# Patient Record
Sex: Male | Born: 1935 | Race: White | Hispanic: No | Marital: Married | Smoking: Former smoker
Health system: Southern US, Community
[De-identification: ages and names within clinical notes are randomized; demographics above are authoritative.]

## PROBLEM LIST (undated history)

## (undated) DIAGNOSIS — I10 Essential (primary) hypertension: Secondary | ICD-10-CM

## (undated) HISTORY — PX: INGUINAL HERNIA REPAIR: SUR1180

## (undated) HISTORY — PX: EYE SURGERY: SHX253

## (undated) HISTORY — PX: SPINE SURGERY: SHX786

## (undated) HISTORY — DX: Essential (primary) hypertension: I10

---

## 1999-01-30 ENCOUNTER — Emergency Department (HOSPITAL_COMMUNITY): Admission: EM | Admit: 1999-01-30 | Discharge: 1999-01-30 | Payer: Self-pay | Admitting: Emergency Medicine

## 1999-01-30 ENCOUNTER — Encounter: Payer: Self-pay | Admitting: Emergency Medicine

## 1999-05-15 ENCOUNTER — Encounter: Admission: RE | Admit: 1999-05-15 | Discharge: 1999-05-15 | Payer: Self-pay | Admitting: Neurosurgery

## 1999-05-15 ENCOUNTER — Encounter: Payer: Self-pay | Admitting: Neurosurgery

## 2002-08-29 ENCOUNTER — Ambulatory Visit (HOSPITAL_COMMUNITY): Admission: RE | Admit: 2002-08-29 | Discharge: 2002-08-29 | Payer: Self-pay | Admitting: Ophthalmology

## 2008-01-05 ENCOUNTER — Ambulatory Visit: Payer: Self-pay | Admitting: Internal Medicine

## 2008-01-05 DIAGNOSIS — K5730 Diverticulosis of large intestine without perforation or abscess without bleeding: Secondary | ICD-10-CM | POA: Insufficient documentation

## 2008-01-05 DIAGNOSIS — R634 Abnormal weight loss: Secondary | ICD-10-CM | POA: Insufficient documentation

## 2008-01-05 DIAGNOSIS — Z860100 Personal history of colon polyps, unspecified: Secondary | ICD-10-CM | POA: Insufficient documentation

## 2010-09-19 NOTE — Op Note (Signed)
NAME:  Casey Davila, Casey Davila                          ACCOUNT NO.:  [id number]   MEDICAL RECORD NO.:  [id number]                   PATIENT TYPE:  OIB   LOCATION:  2874                                 FACILITY:  [facility]   PHYSICIAN:  Robert L. Dione Booze, M.D.               DATE OF BIRTH:  January 06, 1926   DATE OF PROCEDURE:  08/29/2002  DATE OF DISCHARGE:                                 OPERATIVE REPORT   INDICATIONS FOR PROCEDURE:  The patient was seen in my office on June 02, 2002, with the complaint that he had redundant skin of each upper eyelid and  also wanting to be looked at for possible macular degeneration.  He is 77  years old and is seen through the referral of Dr. Elesa Massed.  His mother  apparently had macular degeneration in her later years. He was bothered  significantly by the skin of each upper lid and this blocked his upper field  and he could feel the weight and caused fatigue.  Photographs were done  documenting the condition and the pupil margin reflex distance is 0, that is  the skin comes to the center of each pupil or actually droops over the  pupil. Visual field testing impressively shows the large amount of loss of  the superior field.  Photographs document this.  The pupils motility lids,  conjunctivae, cornea, anterior chamber, and dilated fundus examination does  show some sign of macular disease on the left and there are nuclear and  cortical cataract lens changes. On the left, the patient can be corrected to  20/25 vision in the right eye and 20/30 in the left.  After discussing the  problem, he decided he wanted to have upper eyelid blepharoplasties in order  to open up his visual field.  Please note that he wants this done for  medical reasons because of his medical complaints.  He is followed by  Geoffry Paradise, M.D. and he should be stable for this.   Justification for performing the procedure in outpatient setting is routine.  Justification for overnight stay is  none.   PREOPERATIVE DIAGNOSIS:  Severe dermatochalasis with visual impairment.   POSTOPERATIVE DIAGNOSIS:  Severe dermatochalasis with visual impairment.   SURGEON:  Robert L. Dione Booze, M.D.   PROCEDURE:  Upper eyelid blepharoplasties.   ANESTHESIA:  1% Xylocaine with epinephrine.   DESCRIPTION OF PROCEDURE:  The patient arrived in the minor surgery room at  [facility] and was prepped and draped in the usual sterile fashion.  The skin to be demarcated was carefully marked off and a frontal lid block  consisting of 1% Xylocaine with epinephrine was given.  Some block was given  under the skin of the upper lid also. The demarcated area of skin was then  excised using a scalpel and scissors and underlying fatty tissue was  excised.  Bleeding was controlled with a battery-powered cautery  and with  pressure.  Each wound was closed with a running 6-0 nylon suture and  pressure patches were applied. The patient left the minor room at [facility] having done well.    FOLLOW UP:  The patient is to be seen in my office next six days to have the  patches removed. He is to keep the eyes patched for three hours.  He is to  keep his head upright for three hours. He is to use warm compresses twice  daily and Polysporin ointment in his eyes at night.                                               Robert L. Dione Booze, M.D.    RLG/MEDQ  D:  08/29/2002  T:  08/29/2002  Job:  [id number]   cc:   Geoffry Paradise, M.D.  [address]  [place]  [place] [address]  Fax: [phone]

## 2013-03-17 ENCOUNTER — Other Ambulatory Visit: Payer: Self-pay | Admitting: Internal Medicine

## 2013-03-17 DIAGNOSIS — R109 Unspecified abdominal pain: Secondary | ICD-10-CM

## 2013-03-21 ENCOUNTER — Ambulatory Visit
Admission: RE | Admit: 2013-03-21 | Discharge: 2013-03-21 | Disposition: A | Discharge status: Home / Self Care | Payer: Medicare Other | Source: Ambulatory Visit | Attending: Internal Medicine | Admitting: Internal Medicine

## 2013-03-21 DIAGNOSIS — R109 Unspecified abdominal pain: Secondary | ICD-10-CM

## 2013-03-21 MED ORDER — IOHEXOL 300 MG/ML  SOLN
100.0000 mL | Freq: Once | INTRAMUSCULAR | Status: AC | PRN
  Administered 2013-03-21: 100 mL via INTRAVENOUS

## 2013-04-04 ENCOUNTER — Encounter (INDEPENDENT_AMBULATORY_CARE_PROVIDER_SITE_OTHER): Payer: Self-pay

## 2013-04-04 ENCOUNTER — Encounter (INDEPENDENT_AMBULATORY_CARE_PROVIDER_SITE_OTHER): Payer: Self-pay | Admitting: Surgery

## 2013-04-04 ENCOUNTER — Telehealth (INDEPENDENT_AMBULATORY_CARE_PROVIDER_SITE_OTHER): Payer: Self-pay

## 2013-04-04 ENCOUNTER — Ambulatory Visit (INDEPENDENT_AMBULATORY_CARE_PROVIDER_SITE_OTHER): Payer: Medicare Other | Admitting: Surgery

## 2013-04-04 VITALS — BP 146/80 | HR 80 | Temp 97.0°F | Resp 16 | Ht 71.0 in | Wt 160.0 lb

## 2013-04-04 DIAGNOSIS — K4190 Unilateral femoral hernia, without obstruction or gangrene, not specified as recurrent: Secondary | ICD-10-CM

## 2013-04-04 DIAGNOSIS — N433 Hydrocele, unspecified: Secondary | ICD-10-CM | POA: Insufficient documentation

## 2013-04-04 NOTE — Patient Instructions (Signed)
Hernia A hernia occurs when an internal organ pushes out through a weak spot in the abdominal wall. Hernias most commonly occur in the groin and around the navel. Hernias often can be pushed back into place (reduced). Most hernias tend to get worse over time. Some abdominal hernias can get stuck in the opening (irreducible or incarcerated hernia) and cannot be reduced. An irreducible abdominal hernia which is tightly squeezed into the opening is at risk for impaired blood supply (strangulated hernia). A strangulated hernia is a medical emergency. Because of the risk for an irreducible or strangulated hernia, surgery may be recommended to repair a hernia. CAUSES   Heavy lifting.  Prolonged coughing.  Straining to have a bowel movement.  A cut (incision) made during an abdominal surgery. HOME CARE INSTRUCTIONS   Bed rest is not required. You may continue your normal activities.  Avoid lifting more than 10 pounds (4.5 kg) or straining.  Cough gently. If you are a smoker it is best to stop. Even the best hernia repair can break down with the continual strain of coughing. Even if you do not have your hernia repaired, a cough will continue to aggravate the problem.  Do not wear anything tight over your hernia. Do not try to keep it in with an outside bandage or truss. These can damage abdominal contents if they are trapped within the hernia sac.  Eat a normal diet.  Avoid constipation. Straining over long periods of time will increase hernia size and encourage breakdown of repairs. If you cannot do this with diet alone, stool softeners may be used. SEEK IMMEDIATE MEDICAL CARE IF:   You have a fever.  You develop increasing abdominal pain.  You feel nauseous or vomit.  Your hernia is stuck outside the abdomen, looks discolored, feels hard, or is tender.  You have any changes in your bowel habits or in the hernia that are unusual for you.  You have increased pain or swelling around the  hernia.  You cannot push the hernia back in place by applying gentle pressure while lying down. MAKE SURE YOU:   Understand these instructions.  Will watch your condition.  Will get help right away if you are not doing well or get worse. Document Released: 04/20/2005 Document Revised: 07/13/2011 Document Reviewed: 12/08/2007 Exit[employer] Patient Information 2014 [employer].  

## 2013-04-04 NOTE — Telephone Encounter (Signed)
Referral given to [facility] for appt with Dr Annabell Howells in urology.

## 2013-04-04 NOTE — Progress Notes (Signed)
General Surgery [facility]  Chief Complaint  Patient presents with  . Abdominal Pain    right lower quadrant abdominal pain - referral from Dr. Geoffry Paradise, [facility]   HISTORY: Patient is a an 79 -year-old male referred by his primary care physician for a newly diagnosed right sided hernia. Patient had undergone a previous right inguinal hernia repair by Dr. [name] approximately 5 years ago. Approximately 6 weeks ago the patient noted discomfort in the right groin. This resolved. Approximately 3 weeks ago the patient experienced a recurrence of his discomfort. He was seen and evaluated in the office and felt to have a possible recurrence of his right-sided hernia. CT scan of the abdomen and pelvis was obtained on 03/21/2013. This showed a small left inguinal hernia containing fat. There was a right femoral hernia containing fluid. There was also moderate enlargement of the prostate which was inhomogeneous.  Patient also notes a long-standing hydrocele for which he wears a truss for support.  Past Medical History  Diagnosis Date  . Hypertension     Current Outpatient Prescriptions  Medication Sig Dispense Refill  . amLODipine (NORVASC) 10 MG tablet Take 10 mg by mouth daily. One half tab daily      . Cholecalciferol (D3-1000 PO) Take by mouth.      . hydrochlorothiazide (HYDRODIURIL) 25 MG tablet Take 25 mg by mouth daily.      Marland Kitchen losartan (COZAAR) 100 MG tablet Take 100 mg by mouth daily.      Marland Kitchen OVER THE COUNTER MEDICATION Take 50 mg by mouth daily. Vision formula luten       No current facility-administered medications for this visit.    No Known Allergies  Family History  Problem Relation Age of Onset  . Cancer Father     History   Social History  . Marital Status: Married    Spouse Name: N/A    Number of Children: N/A  . Years of Education: N/A   Social History Main Topics  . Smoking status: Former Smoker    Quit date:  04/04/1973  . Smokeless tobacco: None  . Alcohol Use: Yes     Comment: wine occ  . Drug Use: No  . Sexual Activity: None   Other Topics Concern  . None   Social History Narrative  . None    REVIEW OF SYSTEMS - PERTINENT POSITIVES ONLY: Denies any signs or symptoms of obstruction. No change in bowel habits. No problems with voiding. Intermittent pain lasting approximately 2 hours.  EXAM: Filed Vitals:   04/04/13 1116  BP: 146/80  Pulse: 80  Temp: 97 F (36.1 C)  Resp: 16    HEENT: normocephalic; pupils equal and reactive; sclerae clear; dentition good; mucous membranes moist NECK:  symmetric on extension; no palpable anterior or posterior cervical lymphadenopathy; no supraclavicular masses; no tenderness CHEST: clear to auscultation bilaterally without rales, rhonchi, or wheezes CARDIAC: regular rate and rhythm without significant murmur; peripheral pulses are full ABDOMEN: soft without distension; bowel sounds present; no mass; no hepatosplenomegaly; no hernia GU:  Normal male genitalia without mass or lesion; large hydrocele right scrotum, nontender; palpable right femoral hernia, approximately 2 cm, minimally tender, not reducible; well-healed right inguinal incision; palpation in the right inguinal canal and left inguinal canal with cough and Valsalva shows no sign of inguinal hernia bilaterally EXT:  non-tender without edema; no deformity NEURO: no gross focal deficits; no sign of tremor   LABORATORY RESULTS: See [facility] (CHL-Epic) for most recent results  RADIOLOGY RESULTS: See [facility] (CHL-Epic) for most recent results  IMPRESSION: #1 right femoral hernia, symptomatic #2 right hydrocele, moderate, symptomatic  PLAN: I discussed the above findings at length with the patient. He would like to undergo repair of his right femoral hernia. I think it would be straightforward for him to undergo repair of his hydrocele concurrently. I will discuss this  with Dr. Bjorn Pippin from urology and we will arrange for consultation. Patient should be evaluated at that time for benign prostatic hypertrophy. Hopefully we can perform a procedure for repair of femoral hernia and hydrocele as an outpatient at a time in the near future which is convenient for the patient.  The risks and benefits of the procedure have been discussed at length with the patient.  The patient understands the proposed procedure, potential alternative treatments, and the course of recovery to be expected.  All of the patient's questions have been answered at this time.  The patient wishes to proceed with surgery.  Velora Heckler, MD, FACS General & Endocrine Surgery [facility]  Primary Care Physician: Minda Meo, MD

## 2013-04-11 ENCOUNTER — Telehealth (INDEPENDENT_AMBULATORY_CARE_PROVIDER_SITE_OTHER): Payer: Self-pay | Admitting: General Surgery

## 2013-04-11 NOTE — Telephone Encounter (Signed)
Pt called to ask why his surgery was going to be in January.  Explained that the scheduler had to co-ordinate Dr. Michaell Cowing and Dr. Annabell Howells to be available at the same time on the same day, as well as an operating room for the length of time necessary.  Also, it is ultimately safer for him to be under anesthesia only one time, rather than twice for two separate surgeries.  After discuss and answering his questions, he is satisfied and will wait to hear from [place] on the date an time.

## 2013-04-12 ENCOUNTER — Emergency Department (HOSPITAL_COMMUNITY)
Admission: EM | Admit: 2013-04-12 | Discharge: 2013-04-13 | Disposition: A | Discharge status: Home / Self Care | Payer: Medicare Other | Attending: Emergency Medicine | Admitting: Emergency Medicine

## 2013-04-12 ENCOUNTER — Encounter (HOSPITAL_COMMUNITY): Payer: Self-pay | Admitting: Emergency Medicine

## 2013-04-12 DIAGNOSIS — Z79899 Other long term (current) drug therapy: Secondary | ICD-10-CM | POA: Insufficient documentation

## 2013-04-12 DIAGNOSIS — Z87891 Personal history of nicotine dependence: Secondary | ICD-10-CM | POA: Insufficient documentation

## 2013-04-12 DIAGNOSIS — K469 Unspecified abdominal hernia without obstruction or gangrene: Secondary | ICD-10-CM | POA: Insufficient documentation

## 2013-04-12 DIAGNOSIS — N433 Hydrocele, unspecified: Secondary | ICD-10-CM | POA: Insufficient documentation

## 2013-04-12 DIAGNOSIS — I10 Essential (primary) hypertension: Secondary | ICD-10-CM | POA: Insufficient documentation

## 2013-04-12 MED ORDER — ONDANSETRON HCL 4 MG/2ML IJ SOLN
4.0000 mg | Freq: Once | INTRAMUSCULAR | Status: AC
  Administered 2013-04-13: 4 mg via INTRAVENOUS
  Filled 2013-04-12: qty 2

## 2013-04-12 MED ORDER — FENTANYL CITRATE 0.05 MG/ML IJ SOLN
50.0000 ug | Freq: Once | INTRAMUSCULAR | Status: AC
  Administered 2013-04-13: 50 ug via INTRAVENOUS
  Filled 2013-04-12: qty 2

## 2013-04-12 NOTE — ED Provider Notes (Signed)
CSN: [id number]     Arrival date & time 04/12/13  2238 History   First MD Initiated Contact with Patient 04/12/13 2250     Chief Complaint  Patient presents with  . Groin Pain   (Consider location/radiation/quality/duration/timing/severity/associated sxs/prior Treatment) Patient is a 79 y.o. male presenting with groin pain.  Groin Pain Pertinent negatives include no abdominal pain or chest pain.   79 yo male with hx of RIGHT femoral hernia presents with Right sided groin pain since 8 pm tonight. Herniation occurred after showering. Patient states this has been going on for several months and usually goes away on its own. Patient Describes pain as 7/10 dull and aching. Patient denies anything making it worse or better. Has taken 325 mg aspirin with no pain relief. Denies N/V/D/C, Fever, Chills.  Past Medical History  Diagnosis Date  . Hypertension    Past Surgical History  Procedure Laterality Date  . Inguinal hernia repair Right 25 years ago  . Eye surgery    . Spine surgery  15 years ago   Family History  Problem Relation Age of Onset  . Cancer Father    History  Substance Use Topics  . Smoking status: Former Smoker    Quit date: 04/04/1973  . Smokeless tobacco: Not on file  . Alcohol Use: Yes     Comment: wine occ    Review of Systems  Respiratory: Negative for shortness of breath.   Cardiovascular: Negative for chest pain.  Gastrointestinal: Negative for abdominal pain and blood in stool.  Genitourinary: Negative for dysuria, hematuria and difficulty urinating.    Allergies  Review of patient's allergies indicates no known allergies.  Home Medications   Current Outpatient Rx  Name  Route  Sig  Dispense  Refill  . amLODipine (NORVASC) 10 MG tablet   Oral   Take 10 mg by mouth daily. One half tab daily         . Cholecalciferol (D3-1000 PO)   Oral   Take 1 tablet by mouth daily.          . hydrochlorothiazide (HYDRODIURIL) 25 MG tablet   Oral   Take  25 mg by mouth daily.         Marland Kitchen losartan (COZAAR) 100 MG tablet   Oral   Take 100 mg by mouth daily.         Marland Kitchen OVER THE COUNTER MEDICATION   Oral   Take 50 mg by mouth daily. Vision formula luten         . ondansetron (ZOFRAN ODT) 4 MG disintegrating tablet      4mg  ODT q4 hours prn nausea/vomit   8 tablet   0   . oxyCODONE-acetaminophen (PERCOCET/ROXICET) 5-325 MG per tablet   Oral   Take 1 tablet by mouth every 6 (six) hours as needed for moderate pain or severe pain.   12 tablet   0    BP 154/75  Pulse 77  Temp(Src) 98 F (36.7 C) (Oral)  Resp 19  SpO2 96% Physical Exam  Nursing note and vitals reviewed. Constitutional: He is oriented to person, place, and time. He appears well-developed and well-nourished.  HENT:  Head: Normocephalic and atraumatic.  Cardiovascular: Normal rate, regular rhythm and normal heart sounds.   Pulmonary/Chest: Effort normal and breath sounds normal.  Abdominal: Soft. Bowel sounds are normal. He exhibits no distension. There is no tenderness.  Genitourinary: Penis normal.     Right sided hernia with notable bulge at inguinal fold  and audible bowel sounds. Right sided scrotal swelling consistent with hydrocele, no audible bowel sounds.   Neurological: He is alert and oriented to person, place, and time.  Skin: Skin is warm and dry. He is not diaphoretic.  Psychiatric: He has a normal mood and affect. His behavior is normal.    ED Course  Procedures (including critical care time) Labs Review Labs Reviewed  CBC WITH DIFFERENTIAL - Abnormal; Notable for the following:    Hemoglobin 11.6 (*)    HCT 34.3 (*)    RDW 15.6 (*)    All other components within normal limits  BASIC METABOLIC PANEL - Abnormal; Notable for the following:    Sodium 128 (*)    Chloride 88 (*)    Glucose, Bld 109 (*)    GFR calc non Af Amer 73 (*)    GFR calc Af Amer 85 (*)    All other components within normal limits  URINALYSIS, ROUTINE W REFLEX  MICROSCOPIC  CG4 I-STAT (LACTIC ACID)   Imaging Review No results found.  EKG Interpretation   None       MDM   1. Hernia   2. Hydrocele      Patient placed in trendelenberg position, pain controlled, and ice pack placed on RIGHT sided hernia. Hernia non reducible. Bowel sounds present. Exam consistent with previous exam by general surgery on 04/04/13, at which time plans for outpatient surgery were being arranged. Physical exam appears to be patient's baseline according to previous records. Patient's pain appears to come and go though from the hx the hernia appears unchanged.   Labs show hyponatremia, suspect secondary to HCTZ tx. Mild anemia, though patient is asymptomatic. Patient pain controlled in ED. Discussed plan with patient for follow up with surgery. Advised patient to follow up with surgeon tomorrow to expedite the process. Patient discharged in good condition.   Patient discussed with Dr. Eber Hong.   Rudene Anda, PA-C 04/13/13 [id number]

## 2013-04-12 NOTE — ED Notes (Signed)
Bed: XB14 Expected date:  Expected time:  Means of arrival:  Comments: EMS 79yo abd pain Hiatal Hernia

## 2013-04-12 NOTE — ED Notes (Signed)
Pt has had intermittant right groin pain seen by PMD and is scheduled for R inguinal hernia repair 2nd week in January 2015 report pain 8/10 this evening

## 2013-04-13 ENCOUNTER — Telehealth (INDEPENDENT_AMBULATORY_CARE_PROVIDER_SITE_OTHER): Payer: Self-pay

## 2013-04-13 ENCOUNTER — Other Ambulatory Visit: Payer: Self-pay | Admitting: Urology

## 2013-04-13 LAB — CBC WITH DIFFERENTIAL/PLATELET
Eosinophils Absolute: 0.1 10*3/uL (ref 0.0–0.7)
Eosinophils Relative: 1 % (ref 0–5)
Hemoglobin: 11.6 g/dL — ABNORMAL LOW (ref 13.0–17.0)
Lymphocytes Relative: 41 % (ref 12–46)
Lymphs Abs: 1.9 10*3/uL (ref 0.7–4.0)
MCH: 27 pg (ref 26.0–34.0)
MCV: 79.8 fL (ref 78.0–100.0)
Monocytes Relative: 12 % (ref 3–12)
Neutro Abs: 2.1 10*3/uL (ref 1.7–7.7)
Platelets: 196 10*3/uL (ref 150–400)
RBC: 4.3 MIL/uL (ref 4.22–5.81)
WBC: 4.7 10*3/uL (ref 4.0–10.5)

## 2013-04-13 LAB — BASIC METABOLIC PANEL
BUN: 17 mg/dL (ref 6–23)
CO2: 25 mEq/L (ref 19–32)
Calcium: 9.2 mg/dL (ref 8.4–10.5)
Chloride: 88 mEq/L — ABNORMAL LOW (ref 96–112)
GFR calc non Af Amer: 73 mL/min — ABNORMAL LOW (ref >90)
Glucose, Bld: 109 mg/dL — ABNORMAL HIGH (ref 70–99)
Potassium: 3.7 mEq/L (ref 3.5–5.1)
Sodium: 128 mEq/L — ABNORMAL LOW (ref 135–145)

## 2013-04-13 LAB — URINALYSIS, ROUTINE W REFLEX MICROSCOPIC
Bilirubin Urine: NEGATIVE
Glucose, UA: NEGATIVE mg/dL
Hgb urine dipstick: NEGATIVE
Ketones, ur: NEGATIVE mg/dL
Nitrite: NEGATIVE
Urobilinogen, UA: 0.2 mg/dL (ref 0.0–1.0)

## 2013-04-13 LAB — CG4 I-STAT (LACTIC ACID): Lactic Acid, Venous: 1.36 mmol/L (ref 0.5–2.2)

## 2013-04-13 MED ORDER — ONDANSETRON 4 MG PO TBDP: ORAL_TABLET | ORAL | Status: AC

## 2013-04-13 MED ORDER — OXYCODONE-ACETAMINOPHEN 5-325 MG PO TABS: 1.0000 | ORAL_TABLET | Freq: Four times a day (QID) | ORAL | Status: DC | PRN

## 2013-04-13 MED ORDER — HYDROCODONE-ACETAMINOPHEN 5-325 MG PO TABS: 1.0000 | ORAL_TABLET | Freq: Four times a day (QID) | ORAL | Status: DC | PRN

## 2013-04-13 NOTE — Telephone Encounter (Signed)
I called pt and spoke with pts wife. Pt is resting and she will pick up RX this morning in case pt needs it for discomfort. Debbie in scheduling office is working with Dr Belva Crome office to try to coordinate case. She will call pt and his wife today after she has clarified date. Pt will refrain from pushing,pulling or straining and call with any concerns.

## 2013-04-13 NOTE — ED Provider Notes (Signed)
79 year old male with a history of a right inguinal hernia repair in the past who has recently had recurrence of pain in the right groin. According to the medical record he had a recent visit with the general surgeon who noted that he had a right inguinal hernia which was minimally tender and nonreducible as well as a large right-sided hydrocele. On his exam today he has a soft nontender abdomen with and again noted right-sided nonreducible minimally tender inguinal femoral hernia and a large nontender very soft right sided hemiscrotum likely representing a hydrocele. He has no nausea or vomiting.   I discussed the pt's care with Dr. Daphine Deutscher of the surgical service who states that he can be seen in the office - after reviewing the pt's record including the surgical notes and the CT results, his exam is no different today and it doesn't appear that he has an incarcerated or strangulated hernia.  Labs reassuring.  Pt in agreement with dispo after discussion.  Medical screening examination/treatment/procedure(s) were conducted as a shared visit with non-physician practitioner(s) and myself. I personally evaluated the patient during the encounter.   Clinical Impression: R sided inguinal hernia   Vida Roller, MD 04/13/13 [id number]

## 2013-04-13 NOTE — Telephone Encounter (Signed)
Message copied by Joanette Gula on Thu Apr 13, 2013  8:49 AM ------      Message from: Velora Heckler      Created: Thu Apr 13, 2013  7:56 AM       Casey Davila,            Call Mr. Schipani today and see how he is doing.  Went to ER last night for groin pain and was discharged home.            Patient was supposed to see Dr. Annabell Howells in consultation for hydrocele.  We were planning combined procedure in near future.  Don't know if OR is scheduled yet or not.            Let me know if I need to do anything right now.            Thanks,            tmg             ------

## 2013-04-13 NOTE — ED Notes (Signed)
Pt is aware of the need for a urine sample. However states he is unable to provide one at this time. Urinal at bedside.

## 2013-04-15 ENCOUNTER — Emergency Department (HOSPITAL_COMMUNITY): Payer: Medicare Other | Admitting: Anesthesiology

## 2013-04-15 ENCOUNTER — Inpatient Hospital Stay (HOSPITAL_COMMUNITY): Payer: Medicare Other

## 2013-04-15 ENCOUNTER — Inpatient Hospital Stay (HOSPITAL_COMMUNITY)
Admission: EM | Admit: 2013-04-15 | Discharge: 2013-05-04 | DRG: 350 | Disposition: E | Payer: Medicare Other | Attending: Internal Medicine | Admitting: Internal Medicine

## 2013-04-15 ENCOUNTER — Emergency Department (HOSPITAL_COMMUNITY): Payer: Medicare Other

## 2013-04-15 ENCOUNTER — Encounter (HOSPITAL_COMMUNITY): Admission: EM | Disposition: E | Payer: Self-pay | Source: Home / Self Care | Attending: Pulmonary Disease

## 2013-04-15 ENCOUNTER — Encounter (HOSPITAL_COMMUNITY): Payer: Self-pay | Admitting: Emergency Medicine

## 2013-04-15 ENCOUNTER — Encounter (HOSPITAL_COMMUNITY): Payer: Medicare Other | Admitting: Anesthesiology

## 2013-04-15 DIAGNOSIS — A419 Sepsis, unspecified organism: Secondary | ICD-10-CM | POA: Diagnosis present

## 2013-04-15 DIAGNOSIS — I4729 Other ventricular tachycardia: Secondary | ICD-10-CM | POA: Diagnosis not present

## 2013-04-15 DIAGNOSIS — IMO0001 Reserved for inherently not codable concepts without codable children: Principal | ICD-10-CM | POA: Diagnosis present

## 2013-04-15 DIAGNOSIS — E872 Acidosis, unspecified: Secondary | ICD-10-CM | POA: Diagnosis not present

## 2013-04-15 DIAGNOSIS — K4130 Unilateral femoral hernia, with obstruction, without gangrene, not specified as recurrent: Secondary | ICD-10-CM

## 2013-04-15 DIAGNOSIS — J9600 Acute respiratory failure, unspecified whether with hypoxia or hypercapnia: Secondary | ICD-10-CM | POA: Diagnosis not present

## 2013-04-15 DIAGNOSIS — R109 Unspecified abdominal pain: Secondary | ICD-10-CM

## 2013-04-15 DIAGNOSIS — D696 Thrombocytopenia, unspecified: Secondary | ICD-10-CM

## 2013-04-15 DIAGNOSIS — D691 Qualitative platelet defects: Secondary | ICD-10-CM | POA: Diagnosis present

## 2013-04-15 DIAGNOSIS — K56609 Unspecified intestinal obstruction, unspecified as to partial versus complete obstruction: Secondary | ICD-10-CM

## 2013-04-15 DIAGNOSIS — I10 Essential (primary) hypertension: Secondary | ICD-10-CM | POA: Diagnosis present

## 2013-04-15 DIAGNOSIS — I472 Ventricular tachycardia, unspecified: Secondary | ICD-10-CM | POA: Diagnosis not present

## 2013-04-15 DIAGNOSIS — J690 Pneumonitis due to inhalation of food and vomit: Secondary | ICD-10-CM

## 2013-04-15 DIAGNOSIS — K560 Paralytic ileus: Secondary | ICD-10-CM | POA: Diagnosis not present

## 2013-04-15 DIAGNOSIS — N179 Acute kidney failure, unspecified: Secondary | ICD-10-CM | POA: Diagnosis present

## 2013-04-15 DIAGNOSIS — R579 Shock, unspecified: Secondary | ICD-10-CM

## 2013-04-15 DIAGNOSIS — E876 Hypokalemia: Secondary | ICD-10-CM | POA: Diagnosis not present

## 2013-04-15 DIAGNOSIS — D72829 Elevated white blood cell count, unspecified: Secondary | ICD-10-CM | POA: Diagnosis not present

## 2013-04-15 DIAGNOSIS — E46 Unspecified protein-calorie malnutrition: Secondary | ICD-10-CM

## 2013-04-15 DIAGNOSIS — J80 Acute respiratory distress syndrome: Secondary | ICD-10-CM

## 2013-04-15 DIAGNOSIS — I4891 Unspecified atrial fibrillation: Secondary | ICD-10-CM | POA: Diagnosis not present

## 2013-04-15 DIAGNOSIS — E871 Hypo-osmolality and hyponatremia: Secondary | ICD-10-CM | POA: Diagnosis present

## 2013-04-15 DIAGNOSIS — Y838 Other surgical procedures as the cause of abnormal reaction of the patient, or of later complication, without mention of misadventure at the time of the procedure: Secondary | ICD-10-CM | POA: Diagnosis not present

## 2013-04-15 DIAGNOSIS — Z515 Encounter for palliative care: Secondary | ICD-10-CM

## 2013-04-15 DIAGNOSIS — D649 Anemia, unspecified: Secondary | ICD-10-CM | POA: Diagnosis not present

## 2013-04-15 DIAGNOSIS — E43 Unspecified severe protein-calorie malnutrition: Secondary | ICD-10-CM | POA: Diagnosis present

## 2013-04-15 DIAGNOSIS — E870 Hyperosmolality and hypernatremia: Secondary | ICD-10-CM | POA: Diagnosis not present

## 2013-04-15 DIAGNOSIS — R7989 Other specified abnormal findings of blood chemistry: Secondary | ICD-10-CM

## 2013-04-15 DIAGNOSIS — J150 Pneumonia due to Klebsiella pneumoniae: Secondary | ICD-10-CM | POA: Diagnosis not present

## 2013-04-15 DIAGNOSIS — K929 Disease of digestive system, unspecified: Secondary | ICD-10-CM | POA: Diagnosis not present

## 2013-04-15 DIAGNOSIS — Z66 Do not resuscitate: Secondary | ICD-10-CM | POA: Diagnosis not present

## 2013-04-15 DIAGNOSIS — Z87891 Personal history of nicotine dependence: Secondary | ICD-10-CM

## 2013-04-15 DIAGNOSIS — K4190 Unilateral femoral hernia, without obstruction or gangrene, not specified as recurrent: Secondary | ICD-10-CM

## 2013-04-15 HISTORY — PX: INGUINAL HERNIA REPAIR: SHX194

## 2013-04-15 LAB — URINALYSIS, ROUTINE W REFLEX MICROSCOPIC
Hgb urine dipstick: NEGATIVE
Leukocytes, UA: NEGATIVE
Nitrite: NEGATIVE
Protein, ur: NEGATIVE mg/dL
Specific Gravity, Urine: 1.025 (ref 1.005–1.030)
Urobilinogen, UA: 0.2 mg/dL (ref 0.0–1.0)

## 2013-04-15 LAB — BASIC METABOLIC PANEL
CO2: 24 mEq/L (ref 19–32)
Calcium: 8.4 mg/dL (ref 8.4–10.5)
Creatinine, Ser: 1.5 mg/dL — ABNORMAL HIGH (ref 0.50–1.35)
GFR calc non Af Amer: 41 mL/min — ABNORMAL LOW (ref >90)
Potassium: 3.8 mEq/L (ref 3.5–5.1)

## 2013-04-15 LAB — BLOOD GAS, ARTERIAL
Acid-base deficit: 0.6 mmol/L (ref 0.0–2.0)
Drawn by: 308601
FIO2: 1 %
MECHVT: 600 mL
PEEP: 5 cmH2O
Patient temperature: 98.6
RATE: 14 resp/min
TCO2: 21.7 mmol/L (ref 0–100)
pH, Arterial: 7.375 (ref 7.350–7.450)
pO2, Arterial: 91.7 mmHg (ref 80.0–100.0)

## 2013-04-15 LAB — COMPREHENSIVE METABOLIC PANEL
ALT: 19 U/L (ref 0–53)
BUN: 44 mg/dL — ABNORMAL HIGH (ref 6–23)
Calcium: 9.9 mg/dL (ref 8.4–10.5)
Creatinine, Ser: 1.41 mg/dL — ABNORMAL HIGH (ref 0.50–1.35)
GFR calc Af Amer: 51 mL/min — ABNORMAL LOW (ref >90)
GFR calc non Af Amer: 44 mL/min — ABNORMAL LOW (ref >90)
Glucose, Bld: 195 mg/dL — ABNORMAL HIGH (ref 70–99)
Potassium: 3.5 mEq/L (ref 3.5–5.1)
Sodium: 130 mEq/L — ABNORMAL LOW (ref 135–145)
Total Protein: 7.2 g/dL (ref 6.0–8.3)

## 2013-04-15 LAB — CBC WITH DIFFERENTIAL/PLATELET
Eosinophils Absolute: 0 10*3/uL (ref 0.0–0.7)
Eosinophils Relative: 0 % (ref 0–5)
HCT: 41.2 % (ref 39.0–52.0)
Lymphs Abs: 0.5 10*3/uL — ABNORMAL LOW (ref 0.7–4.0)
MCH: 27.8 pg (ref 26.0–34.0)
MCV: 80.6 fL (ref 78.0–100.0)
Monocytes Absolute: 2.1 10*3/uL — ABNORMAL HIGH (ref 0.1–1.0)
Monocytes Relative: 13 % — ABNORMAL HIGH (ref 3–12)
Neutro Abs: 13.3 10*3/uL — ABNORMAL HIGH (ref 1.7–7.7)
Platelets: 224 10*3/uL (ref 150–400)
RBC: 5.11 MIL/uL (ref 4.22–5.81)
RDW: 16 % — ABNORMAL HIGH (ref 11.5–15.5)

## 2013-04-15 LAB — LACTIC ACID, PLASMA
Lactic Acid, Venous: 7 mmol/L — ABNORMAL HIGH (ref 0.5–2.2)
Lactic Acid, Venous: 5 mmol/L — ABNORMAL HIGH (ref 0.5–2.2)

## 2013-04-15 LAB — PRO B NATRIURETIC PEPTIDE: Pro B Natriuretic peptide (BNP): 448 pg/mL (ref 0–450)

## 2013-04-15 SURGERY — REPAIR, HERNIA, INGUINAL, ADULT
Anesthesia: General | Site: Groin | Laterality: Right

## 2013-04-15 MED ORDER — CHLORHEXIDINE GLUCONATE 0.12 % MT SOLN
15.0000 mL | Freq: Two times a day (BID) | OROMUCOSAL | Status: DC
  Administered 2013-04-15 – 2013-04-28 (×26): 15 mL via OROMUCOSAL
  Filled 2013-04-15 (×26): qty 15

## 2013-04-15 MED ORDER — PIPERACILLIN-TAZOBACTAM 3.375 G IVPB
3.3750 g | Freq: Three times a day (TID) | INTRAVENOUS | Status: DC
  Administered 2013-04-15 – 2013-04-20 (×14): 3.375 g via INTRAVENOUS
  Filled 2013-04-15 (×15): qty 50

## 2013-04-15 MED ORDER — ONDANSETRON HCL 4 MG/2ML IJ SOLN
INTRAMUSCULAR | Status: DC | PRN
  Administered 2013-04-15 (×2): 2 mg via INTRAVENOUS

## 2013-04-15 MED ORDER — PIPERACILLIN-TAZOBACTAM 3.375 G IVPB 30 MIN: 3.3750 g | Freq: Once | INTRAVENOUS | Status: DC

## 2013-04-15 MED ORDER — LACTATED RINGERS IV SOLN
INTRAVENOUS | Status: DC | PRN
  Administered 2013-04-15: 17:00:00 via INTRAVENOUS

## 2013-04-15 MED ORDER — CISATRACURIUM BESYLATE (PF) 10 MG/5ML IV SOLN
INTRAVENOUS | Status: DC | PRN
  Administered 2013-04-15: 10 mg via INTRAVENOUS
  Administered 2013-04-15: 5 mg via INTRAVENOUS

## 2013-04-15 MED ORDER — MIDAZOLAM HCL 2 MG/2ML IJ SOLN
INTRAMUSCULAR | Status: AC
  Filled 2013-04-15: qty 2

## 2013-04-15 MED ORDER — MEPERIDINE HCL 50 MG/ML IJ SOLN: 6.2500 mg | INTRAMUSCULAR | Status: DC | PRN

## 2013-04-15 MED ORDER — SODIUM CHLORIDE 0.9 % IV BOLUS (SEPSIS)
500.0000 mL | Freq: Once | INTRAVENOUS | Status: AC
  Administered 2013-04-15: 500 mL via INTRAVENOUS

## 2013-04-15 MED ORDER — LIDOCAINE HCL (CARDIAC) 20 MG/ML IV SOLN: INTRAVENOUS | Status: DC | PRN

## 2013-04-15 MED ORDER — MORPHINE SULFATE 2 MG/ML IJ SOLN
2.0000 mg | Freq: Once | INTRAMUSCULAR | Status: AC
  Administered 2013-04-15: 2 mg via INTRAVENOUS
  Filled 2013-04-15: qty 1

## 2013-04-15 MED ORDER — FENTANYL CITRATE 0.05 MG/ML IJ SOLN
INTRAMUSCULAR | Status: AC
  Filled 2013-04-15: qty 2

## 2013-04-15 MED ORDER — SODIUM CHLORIDE 0.9 % IJ SOLN
INTRAMUSCULAR | Status: AC
  Filled 2013-04-15: qty 10

## 2013-04-15 MED ORDER — GLYCOPYRROLATE 0.2 MG/ML IJ SOLN
INTRAMUSCULAR | Status: AC
  Filled 2013-04-15: qty 1

## 2013-04-15 MED ORDER — IOHEXOL 350 MG/ML SOLN
100.0000 mL | Freq: Once | INTRAVENOUS | Status: AC | PRN
  Administered 2013-04-15: 100 mL via INTRAVENOUS

## 2013-04-15 MED ORDER — 0.9 % SODIUM CHLORIDE (POUR BTL) OPTIME
TOPICAL | Status: DC | PRN
  Administered 2013-04-15: 1000 mL

## 2013-04-15 MED ORDER — PROPOFOL 10 MG/ML IV BOLUS
INTRAVENOUS | Status: AC
  Filled 2013-04-15: qty 20

## 2013-04-15 MED ORDER — LIDOCAINE HCL (CARDIAC) 20 MG/ML IV SOLN
INTRAVENOUS | Status: DC | PRN
  Administered 2013-04-15: 20 mg via INTRAVENOUS

## 2013-04-15 MED ORDER — KETAMINE HCL 10 MG/ML IJ SOLN
INTRAMUSCULAR | Status: DC | PRN
  Administered 2013-04-15: 25 mg via INTRAVENOUS

## 2013-04-15 MED ORDER — SODIUM CHLORIDE 0.9 % IV SOLN
INTRAVENOUS | Status: DC | PRN
  Administered 2013-04-15: 16:00:00 via INTRAVENOUS

## 2013-04-15 MED ORDER — CISATRACURIUM BESYLATE 20 MG/10ML IV SOLN
INTRAVENOUS | Status: AC
  Filled 2013-04-15: qty 20

## 2013-04-15 MED ORDER — HEPARIN SODIUM (PORCINE) 5000 UNIT/ML IJ SOLN
5000.0000 [IU] | Freq: Three times a day (TID) | INTRAMUSCULAR | Status: DC
  Administered 2013-04-15 – 2013-04-18 (×8): 5000 [IU] via SUBCUTANEOUS
  Filled 2013-04-15 (×11): qty 1

## 2013-04-15 MED ORDER — SODIUM CHLORIDE 0.9 % IV SOLN
25.0000 ug/h | INTRAVENOUS | Status: DC
  Administered 2013-04-15: 25 ug/h via INTRAVENOUS
  Administered 2013-04-18: 45 ug/h via INTRAVENOUS
  Administered 2013-04-20: 75 ug/h via INTRAVENOUS
  Administered 2013-04-21: 35 ug/h via INTRAVENOUS
  Administered 2013-04-23: 150 ug/h via INTRAVENOUS
  Administered 2013-04-23 – 2013-04-26 (×5): 100 ug/h via INTRAVENOUS
  Administered 2013-04-27: 125 ug/h via INTRAVENOUS
  Administered 2013-04-28: 200 ug/h via INTRAVENOUS
  Filled 2013-04-15 (×12): qty 50

## 2013-04-15 MED ORDER — SODIUM CHLORIDE 0.9 % IV SOLN
INTRAVENOUS | Status: DC
  Administered 2013-04-15 – 2013-04-16 (×4): via INTRAVENOUS
  Administered 2013-04-17 – 2013-04-18 (×2): 1000 mL via INTRAVENOUS
  Administered 2013-04-20 (×2): via INTRAVENOUS

## 2013-04-15 MED ORDER — SODIUM CHLORIDE 0.9 % IV SOLN: 20.0000 mg/kg | Freq: Once | INTRAVENOUS | Status: DC

## 2013-04-15 MED ORDER — MORPHINE SULFATE 4 MG/ML IJ SOLN
4.0000 mg | Freq: Once | INTRAMUSCULAR | Status: AC
  Administered 2013-04-15: 4 mg via INTRAVENOUS
  Filled 2013-04-15: qty 1

## 2013-04-15 MED ORDER — FENTANYL CITRATE 0.05 MG/ML IJ SOLN: 25.0000 ug | INTRAMUSCULAR | Status: DC | PRN

## 2013-04-15 MED ORDER — ONDANSETRON HCL 4 MG/2ML IJ SOLN
4.0000 mg | Freq: Once | INTRAMUSCULAR | Status: AC
  Administered 2013-04-15: 4 mg via INTRAVENOUS
  Filled 2013-04-15: qty 2

## 2013-04-15 MED ORDER — MIDAZOLAM HCL 5 MG/5ML IJ SOLN
INTRAMUSCULAR | Status: DC | PRN
  Administered 2013-04-15 (×2): 1 mg via INTRAVENOUS

## 2013-04-15 MED ORDER — PROPOFOL 10 MG/ML IV BOLUS
INTRAVENOUS | Status: DC | PRN
  Administered 2013-04-15: 100 mg via INTRAVENOUS

## 2013-04-15 MED ORDER — ONDANSETRON HCL 4 MG/2ML IJ SOLN: 4.0000 mg | Freq: Four times a day (QID) | INTRAMUSCULAR | Status: DC | PRN

## 2013-04-15 MED ORDER — DEXAMETHASONE SODIUM PHOSPHATE 10 MG/ML IJ SOLN
INTRAMUSCULAR | Status: AC
  Filled 2013-04-15: qty 1

## 2013-04-15 MED ORDER — SUCCINYLCHOLINE CHLORIDE 20 MG/ML IJ SOLN
INTRAMUSCULAR | Status: AC
  Filled 2013-04-15: qty 1

## 2013-04-15 MED ORDER — LIDOCAINE HCL (CARDIAC) 20 MG/ML IV SOLN
INTRAVENOUS | Status: AC
  Filled 2013-04-15: qty 5

## 2013-04-15 MED ORDER — ONDANSETRON HCL 4 MG/2ML IJ SOLN
INTRAMUSCULAR | Status: AC
  Filled 2013-04-15: qty 2

## 2013-04-15 MED ORDER — PHENYLEPHRINE HCL 10 MG/ML IJ SOLN
10.0000 mg | INTRAVENOUS | Status: DC | PRN
  Administered 2013-04-15: 75 ug/min via INTRAVENOUS

## 2013-04-15 MED ORDER — PANTOPRAZOLE SODIUM 40 MG IV SOLR
40.0000 mg | INTRAVENOUS | Status: DC
  Administered 2013-04-15 – 2013-04-18 (×4): 40 mg via INTRAVENOUS
  Filled 2013-04-15 (×5): qty 40

## 2013-04-15 MED ORDER — PROPOFOL 10 MG/ML IV EMUL
5.0000 ug/kg/min | INTRAVENOUS | Status: DC
  Administered 2013-04-15: 5 ug/kg/min via INTRAVENOUS

## 2013-04-15 MED ORDER — PROPOFOL 10 MG/ML IV EMUL
INTRAVENOUS | Status: AC
  Filled 2013-04-15: qty 100

## 2013-04-15 MED ORDER — VANCOMYCIN HCL IN DEXTROSE 1-5 GM/200ML-% IV SOLN
1000.0000 mg | INTRAVENOUS | Status: AC
  Administered 2013-04-15: 1000 mg via INTRAVENOUS
  Filled 2013-04-15: qty 200

## 2013-04-15 MED ORDER — PIPERACILLIN-TAZOBACTAM 3.375 G IVPB 30 MIN
3.3750 g | INTRAVENOUS | Status: AC
  Administered 2013-04-15: 3.375 g via INTRAVENOUS
  Filled 2013-04-15: qty 50

## 2013-04-15 MED ORDER — DEXAMETHASONE SODIUM PHOSPHATE 4 MG/ML IJ SOLN
INTRAMUSCULAR | Status: DC | PRN
  Administered 2013-04-15: 10 mg via INTRAVENOUS

## 2013-04-15 MED ORDER — SODIUM CHLORIDE 0.9 % IV SOLN
INTRAVENOUS | Status: DC | PRN
  Administered 2013-04-15: 17:00:00 via INTRAVENOUS

## 2013-04-15 MED ORDER — ONDANSETRON HCL 4 MG/2ML IJ SOLN: 4.0000 mg | Freq: Once | INTRAMUSCULAR | Status: DC | PRN

## 2013-04-15 MED ORDER — PHENYLEPHRINE HCL 10 MG/ML IJ SOLN
INTRAMUSCULAR | Status: AC
  Filled 2013-04-15: qty 1

## 2013-04-15 MED ORDER — EPHEDRINE SULFATE 50 MG/ML IJ SOLN
INTRAMUSCULAR | Status: AC
  Filled 2013-04-15: qty 1

## 2013-04-15 MED ORDER — ALBUTEROL SULFATE HFA 108 (90 BASE) MCG/ACT IN AERS
4.0000 | INHALATION_SPRAY | Freq: Once | RESPIRATORY_TRACT | Status: AC
  Administered 2013-04-15: 4 via RESPIRATORY_TRACT
  Filled 2013-04-15: qty 26.8

## 2013-04-15 MED ORDER — HYDROMORPHONE HCL PF 1 MG/ML IJ SOLN
0.5000 mg | INTRAMUSCULAR | Status: DC | PRN
  Administered 2013-04-15: 0.5 mg via INTRAVENOUS
  Filled 2013-04-15: qty 1

## 2013-04-15 MED ORDER — BIOTENE DRY MOUTH MT LIQD
1.0000 "application " | Freq: Four times a day (QID) | OROMUCOSAL | Status: DC
  Administered 2013-04-16 – 2013-04-28 (×51): 15 mL via OROMUCOSAL

## 2013-04-15 MED ORDER — SUCCINYLCHOLINE CHLORIDE 20 MG/ML IJ SOLN
INTRAMUSCULAR | Status: DC | PRN
  Administered 2013-04-15: 140 mg via INTRAVENOUS

## 2013-04-15 MED ORDER — PROPOFOL 10 MG/ML IV EMUL: 5.0000 ug/kg/min | INTRAVENOUS | Status: DC

## 2013-04-15 MED ORDER — FENTANYL CITRATE 0.05 MG/ML IJ SOLN
INTRAMUSCULAR | Status: DC | PRN
  Administered 2013-04-15 (×4): 25 ug via INTRAVENOUS

## 2013-04-15 MED ORDER — KETAMINE HCL 50 MG/ML IJ SOLN
INTRAMUSCULAR | Status: AC
  Filled 2013-04-15: qty 10

## 2013-04-15 MED ORDER — VANCOMYCIN HCL IN DEXTROSE 750-5 MG/150ML-% IV SOLN
750.0000 mg | Freq: Two times a day (BID) | INTRAVENOUS | Status: DC
  Administered 2013-04-16 – 2013-04-17 (×3): 750 mg via INTRAVENOUS
  Filled 2013-04-15 (×3): qty 150

## 2013-04-15 MED ORDER — STERILE WATER FOR INJECTION IJ SOLN
INTRAMUSCULAR | Status: AC
  Filled 2013-04-15: qty 40

## 2013-04-15 MED ORDER — IOHEXOL 300 MG/ML  SOLN
50.0000 mL | Freq: Once | INTRAMUSCULAR | Status: AC | PRN
  Administered 2013-04-15: 50 mL via ORAL

## 2013-04-15 SURGICAL SUPPLY — 41 items
BENZOIN TINCTURE PRP APPL 2/3 (GAUZE/BANDAGES/DRESSINGS) IMPLANT
BLADE SURG SZ10 CARB STEEL (BLADE) ×3 IMPLANT
DECANTER SPIKE VIAL GLASS SM (MISCELLANEOUS) IMPLANT
DERMABOND ADVANCED (GAUZE/BANDAGES/DRESSINGS) ×1
DERMABOND ADVANCED .7 DNX12 (GAUZE/BANDAGES/DRESSINGS) ×2 IMPLANT
DRAIN PENROSE 18X1/2 LTX STRL (DRAIN) ×3 IMPLANT
DRAPE CAMERA CLOSED 9X96 (DRAPES) ×3 IMPLANT
DRAPE LAPAROTOMY TRNSV 102X78 (DRAPE) ×3 IMPLANT
DRAPE UTILITY XL STRL (DRAPES) ×3 IMPLANT
DRSG TEGADERM 4X4.75 (GAUZE/BANDAGES/DRESSINGS) ×3 IMPLANT
ELECT REM PT RETURN 9FT ADLT (ELECTROSURGICAL) ×3
ELECTRODE REM PT RTRN 9FT ADLT (ELECTROSURGICAL) ×2 IMPLANT
GAUZE SPONGE 4X4 16PLY XRAY LF (GAUZE/BANDAGES/DRESSINGS) ×3 IMPLANT
GLOVE BIO SURGEON STRL SZ 6.5 (GLOVE) ×3 IMPLANT
GLOVE BIOGEL PI IND STRL 7.0 (GLOVE) ×2 IMPLANT
GLOVE BIOGEL PI INDICATOR 7.0 (GLOVE) ×1
GOWN BRE IMP PREV XXLGXLNG (GOWN DISPOSABLE) ×3 IMPLANT
GOWN STRL REIN XL XLG (GOWN DISPOSABLE) ×3 IMPLANT
KIT BASIN OR (CUSTOM PROCEDURE TRAY) ×3 IMPLANT
NEEDLE HYPO 22GX1.5 SAFETY (NEEDLE) ×3 IMPLANT
PACK GENERAL/GYN (CUSTOM PROCEDURE TRAY) ×3 IMPLANT
SLEEVE XCEL OPT CAN 5 100 (ENDOMECHANICALS) ×6 IMPLANT
SOLUTION ANTI FOG 6CC (MISCELLANEOUS) ×3 IMPLANT
SPONGE GAUZE 4X4 12PLY (GAUZE/BANDAGES/DRESSINGS) ×3 IMPLANT
STRIP CLOSURE SKIN 1/2X4 (GAUZE/BANDAGES/DRESSINGS) ×3 IMPLANT
SUT MNCRL AB 4-0 PS2 18 (SUTURE) ×3 IMPLANT
SUT NOVA 0 T19/GS 22DT (SUTURE) ×3 IMPLANT
SUT NOVA NAB GS-21 0 18 T12 DT (SUTURE) ×3 IMPLANT
SUT PROLENE 2 0 CT2 30 (SUTURE) IMPLANT
SUT SILK 2 0 (SUTURE) ×1
SUT SILK 2 0 SH (SUTURE) ×6 IMPLANT
SUT SILK 2 0 SH CR/8 (SUTURE) ×3 IMPLANT
SUT SILK 2-0 30XBRD TIE 12 (SUTURE) ×2 IMPLANT
SUT VIC AB 2-0 SH 27 (SUTURE) ×1
SUT VIC AB 2-0 SH 27X BRD (SUTURE) ×2 IMPLANT
SUT VIC AB 3-0 SH 18 (SUTURE) ×3 IMPLANT
SYR CONTROL 10ML LL (SYRINGE) ×3 IMPLANT
TAPE CLOTH SURG 4X10 WHT LF (GAUZE/BANDAGES/DRESSINGS) ×3 IMPLANT
TOWEL OR 17X26 10 PK STRL BLUE (TOWEL DISPOSABLE) ×3 IMPLANT
TROCAR BLADELESS OPT 5 100 (ENDOMECHANICALS) ×3 IMPLANT
TUBING INSUFFLATION 10FT LAP (TUBING) ×3 IMPLANT

## 2013-04-15 NOTE — Progress Notes (Signed)
Per protocol, CSW called facility listed for pt to confirm residency and level of care.  Per Elease Hashimoto ,confirmed that pt is a resident of [facility] in their Independent Living.  Should pt need a change of level of care, please ensure facility is aware. CSW available for consult.  York Spaniel [place], [phone]     ED CSW  10:38am

## 2013-04-15 NOTE — Anesthesia Postprocedure Evaluation (Signed)
  Anesthesia Post-op Note  Patient: Casey Davila  Procedure(s) Performed: Procedure(s): HERNIA REPAIR INGUINAL ADULT (Right)  Patient Location: ICU  Anesthesia Type:General  Level of Consciousness: sedated and unresponsive  Airway and Oxygen Therapy: Patient remains intubated per anesthesia plan  Post-op Pain: none  Post-op Assessment: Post-op Vital signs reviewed  Post-op Vital Signs: Reviewed and stable  Complications: to remain intubated on ventilator

## 2013-04-15 NOTE — Anesthesia Preprocedure Evaluation (Addendum)
Anesthesia Evaluation  Patient identified by MRN, date of birth, ID band Patient awake    Reviewed: Allergy & Precautions, H&P , Patient's Chart, lab work & pertinent test results, reviewed documented beta blocker date and time   Airway Mallampati: II TM Distance: >3 FB Neck ROM: full    Dental no notable dental hx.    Pulmonary pneumonia -, former smoker,  breath sounds clear to auscultation  + decreased breath sounds (Right side> Left)      Cardiovascular hypertension, On Medications Rhythm:regular Rate:Normal     Neuro/Psych    GI/Hepatic   Endo/Other    Renal/GU      Musculoskeletal   Abdominal   Peds  Hematology   Anesthesia Other Findings RA Sat 78% in Holding area... tachypnic at 36 rpm.  He has non-productive cough. He says he has no respiritory distress. He appears comfortable, and is clear of mentation, and gives a great Hx. Multifocal patchy pulmonary opacities, suspicious for aspiration Pneumonia noted on CT. Small bowel obstruction on the basis of a right femoral hernia, associated (+) distended stomach with dilated, fluid-filled esophagus.  Hyponatremia noted HTN  Reproductive/Obstetrics                         Anesthesia Physical Anesthesia Plan  ASA: III  Anesthesia Plan: General   Post-op Pain Management:    Induction: Rapid sequence and Intravenous  Airway Management Planned: Oral ETT and Video Laryngoscope Planned  Additional Equipment:   Intra-op Plan:   Post-operative Plan: Extubation in OR  Informed Consent: I have reviewed the patients History and Physical, chart, labs and discussed the procedure including the risks, benefits and alternatives for the proposed anesthesia with the patient or authorized representative who has indicated his/her understanding and acceptance.   Dental Advisory Given and Dental advisory given  Plan Discussed with: CRNA and  Surgeon  Anesthesia Plan Comments: Renae Fickle Edmonds is at high risk of further aspiration on induction. We will use the ETT for prolonged intubation/ventilation as this is a possibility. Octaviano Batty CRNA, has 2 suction yankaurs at the head of the bed. We will use the glidescope to examine his glottis on DL.   Discussed  RSI/ general ETT anesthesia, including possible nausea, instrumentation of airway, sore throat, increased risk of pulmonary aspiration, etc. I asked if the were any outstanding questions, or  concerns before we proceeded. He had none.  )       Anesthesia Quick Evaluation

## 2013-04-15 NOTE — ED Notes (Signed)
Pt comes from [facility] for abd pain and constipation that has been going on since Tuesday.  Pt was here on on Wed for hernia pain and schedule upcoming Thursday for hernia repair. Pt drank Moviprep during the night to help clean pt out and has yet to have a BM. Pt's wife gave him Phenergan suppository this morning around 6am for nausea. Pt has lot of belching that smells of bowel and pt abd distended and rigid.

## 2013-04-15 NOTE — ED Notes (Addendum)
Phlebotomy at bedside to attempt labs, unsuccessful

## 2013-04-15 NOTE — Progress Notes (Signed)
eLink Physician-Brief Progress Note Patient Name: Casey Davila DOB: 04-Aug-1925 MRN: [id number]  Date of Service  05/06/2013   HPI/Events of Note   Back from OR ventilated  eICU Interventions   Vent orders ABG / CXR Fentanyl / Propofol GI / VTE / VAP Px Bedside MD to see    Intervention Category Major Interventions: Respiratory failure - evaluation and management  Casey Davila May 06, 2013, 7:50 PM

## 2013-04-15 NOTE — Preoperative (Signed)
Beta Blockers   Reason not to administer Beta Blockers:Not Applicable 

## 2013-04-15 NOTE — ED Notes (Signed)
Pt transported to CT ?

## 2013-04-15 NOTE — ED Notes (Signed)
Pt aware of the need for a urine sample. 

## 2013-04-15 NOTE — ED Notes (Signed)
X RAY at bedside 

## 2013-04-15 NOTE — ED Notes (Signed)
Patient tried to urinate but couldnt 

## 2013-04-15 NOTE — H&P (Signed)
Casey Davila is an 79 y.o. male.   Chief Complaint: nausea, vomiting  HPI: Patient is a an 24 -year-old male referred to Dr [name] by his primary care physician for a newly diagnosed right sided hernia. Patient had undergone a previous right inguinal hernia repair by Dr. [name] approximately 5 years ago. Several weeks ago the patient noted discomfort in the right groin. This resolved. Approximately 3 weeks ago the patient experienced a recurrence of his discomfort. He was seen and evaluated in the office and felt to have a possible recurrence of his right-sided hernia. CT scan of the abdomen and pelvis was obtained on 03/21/2013. This showed a small left inguinal hernia containing fat. There was a right femoral hernia containing fluid only.  He was scheduled for surgery next week.  3 days ago, he developed worsening pain at his hernia site along with progressive constipation and nausea.  He was brought to the ED for evaluation.     Past Medical History  Diagnosis Date  . Hypertension     Past Surgical History  Procedure Laterality Date  . Inguinal hernia repair Right 25 years ago  . Eye surgery    . Spine surgery  15 years ago    Family History  Problem Relation Age of Onset  . Cancer Father    Social History:  reports that he quit smoking about 40 years ago. He does not have any smokeless tobacco history on file. He reports that he drinks alcohol. He reports that he does not use illicit drugs.  Allergies:  Allergies  Allergen Reactions  . Vicodin [Hydrocodone-Acetaminophen] Nausea And Vomiting     (Not in a hospital admission)  Results for orders placed during the hospital encounter of May 04, 2013 (from the past 48 hour(s))  CBC WITH DIFFERENTIAL     Status: Abnormal   Collection Time    May 04, 2013 12:35 PM      Result Value Range   WBC 15.9 (*) 4.0 - 10.5 K/uL   RBC 5.11  4.22 - 5.81 MIL/uL   Hemoglobin 14.2  13.0 - 17.0 g/dL   HCT 19.1  47.8 - 29.5 %   MCV 80.6  78.0 -  100.0 fL   MCH 27.8  26.0 - 34.0 pg   MCHC 34.5  30.0 - 36.0 g/dL   RDW 62.1 (*) 30.8 - 65.7 %   Platelets 224  150 - 400 K/uL   Neutrophils Relative % 84 (*) 43 - 77 %   Neutro Abs 13.3 (*) 1.7 - 7.7 K/uL   Lymphocytes Relative 3 (*) 12 - 46 %   Lymphs Abs 0.5 (*) 0.7 - 4.0 K/uL   Monocytes Relative 13 (*) 3 - 12 %   Monocytes Absolute 2.1 (*) 0.1 - 1.0 K/uL   Eosinophils Relative 0  0 - 5 %   Eosinophils Absolute 0.0  0.0 - 0.7 K/uL   Basophils Relative 0  0 - 1 %   Basophils Absolute 0.0  0.0 - 0.1 K/uL  COMPREHENSIVE METABOLIC PANEL     Status: Abnormal   Collection Time    May 04, 2013 12:35 PM      Result Value Range   Sodium 130 (*) 135 - 145 mEq/L   Potassium 3.5  3.5 - 5.1 mEq/L   Chloride 87 (*) 96 - 112 mEq/L   CO2 21  19 - 32 mEq/L   Glucose, Bld 195 (*) 70 - 99 mg/dL   BUN 44 (*) 6 - 23 mg/dL  Creatinine, Ser 1.41 (*) 0.50 - 1.35 mg/dL   Calcium 9.9  8.4 - 81.1 mg/dL   Total Protein 7.2  6.0 - 8.3 g/dL   Albumin 3.7  3.5 - 5.2 g/dL   AST 28  0 - 37 U/L   ALT 19  0 - 53 U/L   Alkaline Phosphatase 68  39 - 117 U/L   Total Bilirubin 1.3 (*) 0.3 - 1.2 mg/dL   GFR calc non Af Amer 44 (*) >90 mL/min   GFR calc Af Amer 51 (*) >90 mL/min   Comment: (NOTE)     The eGFR has been calculated using the CKD EPI equation.     This calculation has not been validated in all clinical situations.     eGFR's persistently <90 mL/min signify possible Chronic Kidney     Disease.  LACTIC ACID, PLASMA     Status: Abnormal   Collection Time    04/07/2013 12:35 PM      Result Value Range   Lactic Acid, Venous 7.0 (*) 0.5 - 2.2 mmol/L  TROPONIN I     Status: None   Collection Time    04/14/2013 12:35 PM      Result Value Range   Troponin I <0.30  <0.30 ng/mL   Comment:            Due to the release kinetics of cTnI,     a negative result within the first hours     of the onset of symptoms does not rule out     myocardial infarction with certainty.     If myocardial infarction is  still suspected,     repeat the test at appropriate intervals.  PRO B NATRIURETIC PEPTIDE     Status: None   Collection Time    04/18/2013 12:35 PM      Result Value Range   Pro B Natriuretic peptide (BNP) 448.0  0 - 450 pg/mL  URINALYSIS, ROUTINE W REFLEX MICROSCOPIC     Status: Abnormal   Collection Time    04/07/2013  1:15 PM      Result Value Range   Color, Urine AMBER (*) YELLOW   Comment: BIOCHEMICALS MAY BE AFFECTED BY COLOR   APPearance CLOUDY (*) CLEAR   Specific Gravity, Urine 1.025  1.005 - 1.030   pH 5.5  5.0 - 8.0   Glucose, UA NEGATIVE  NEGATIVE mg/dL   Hgb urine dipstick NEGATIVE  NEGATIVE   Bilirubin Urine NEGATIVE  NEGATIVE   Ketones, ur NEGATIVE  NEGATIVE mg/dL   Protein, ur NEGATIVE  NEGATIVE mg/dL   Urobilinogen, UA 0.2  0.0 - 1.0 mg/dL   Nitrite NEGATIVE  NEGATIVE   Leukocytes, UA NEGATIVE  NEGATIVE   Comment: MICROSCOPIC NOT DONE ON URINES WITH NEGATIVE PROTEIN, BLOOD, LEUKOCYTES, NITRITE, OR GLUCOSE <1000 mg/dL.   Ct Angio Abdomen W/cm &/or Wo Contrast  04/23/2013   CLINICAL DATA:  Abdominal pain/distension, ectatic thoracic aorta  EXAM: CT ANGIOGRAPHY CHEST, ABDOMEN AND PELVIS  TECHNIQUE: Multidetector CT imaging through the chest, abdomen and pelvis was performed using the standard protocol during bolus administration of intravenous contrast. Multiplanar reconstructed images including MIPs were obtained and reviewed to evaluate the vascular anatomy.  CONTRAST:  OMNIPAQUE IOHEXOL 350 MG/ML SOLN  COMPARISON:  None.  FINDINGS: CTA CHEST FINDINGS  On unenhanced imaging, there is no evidence of intramural hematoma. No evidence of thoracic aortic aneurysm or dissection. Although not tailored for evaluation of the pulmonary arteries, there is no  evidence of pulmonary embolism.  Mild patchy opacities in the posterior right lower lobe (series 11/ image 20) and bilateral lower lobes (series 11/ image 40), suspicious for multifocal pneumonia, likely on the basis of  aspiration.  Additional 6 mm subpleural nodule in the left lower lobe (series 11/ image 34). No pleural effusion or pneumothorax.  Visualized thyroid is mildly heterogeneous/nodular.  The heart is normal in size. No pericardial effusion. Atherosclerotic calcifications of the aortic arch.  Dilated, fluid-filled esophagus.  No suspicious mediastinal, hilar, or axillary lymphadenopathy.  Mild degenerative changes of the thoracic spine.  Review of the MIP images confirms the above findings.  CTA ABDOMEN AND PELVIS FINDINGS  No evidence of abdominal aortic aneurysm or dissection.  Distended, fluid-filled stomach.  Liver, pancreas, and bilateral adrenal glands are within normal limits.  Lobular contour of the spleen with peripheral calcifications (series 5/image 83), grossly unchanged, possibly reflecting sequela of prior trauma.  Gallbladder is unremarkable. No intrahepatic or extrahepatic ductal dilatation.  9 mm lateral interpolar left renal cyst (series 5/image 107). Right kidney is unremarkable. No hydronephrosis.  Multiple dilated loops of proximal small bowel. Associated debris within distal small bowel in the right lower quadrant (series 5/image 177), leading to a small bowel containing right femoral hernia (series 5/images 180 and 189). Abrupt transition as distal small bowel exits the hernia (series 5/image 178). Colon is largely decompressed. Colonic diverticulosis, without associated inflammatory changes.  Atherosclerotic calcifications of the abdominal aorta and branch vessels.  No evidence of abdominopelvic ascites.  No suspicious abdominopelvic lymphadenopathy.  Prostatomegaly, with enlargement of the central gland which indents the base of the bladder, likely reflecting benign prostatic hyperplasia.  Bladder is within normal limits.  Small fat containing left inguinal hernia (series 5/ image 191).  Degenerative changes of the lumbar spine.  Review of the MIP images confirms the above findings.  IMPRESSION:  No evidence of thoracoabdominal aortic aneurysm or dissection. No evidence of pulmonary embolism.  Small bowel obstruction on the basis of a right femoral hernia. Associated distended stomach with dilated, fluid-filled esophagus.  Multifocal patchy pulmonary opacities, suspicious for aspiration pneumonia.  6 mm subpleural left lower lobe pulmonary nodule. Assuming high risk for primary bronchogenic neoplasm, initial follow-up CT chest is suggested in 6-12 months.  Numerous additional ancillary findings as above.  This recommendation follows the consensus statement: Guidelines for Management of Small Pulmonary Nodules Detected on CT Scans: A Statement from the Fleischner Society as published in Radiology 2005; 237:395-400.  These results were called by telephone at the time of interpretation on 04/14/2013 at 2:26 PM to Dr. Toy Cookey, who verbally acknowledged these results.   Electronically Signed   By: Charline Bills M.D.   On: 04/05/2013 14:40   Dg Chest Portable 1 View  04/18/2013   CLINICAL DATA:  Hypoxia, abdominal pain  EXAM: PORTABLE CHEST - 1 VIEW  COMPARISON:  None.  FINDINGS: Low volume exam with bibasilar opacities/atelectasis, worse in the left lower lobe. Difficult to completely exclude left base pneumonia. No edema or effusion. No pneumothorax. Ectatic thoracic aorta. Normal heart size and vascularity.  IMPRESSION: Bibasilar mild opacities and atelectasis, worse in the left lower lobe. Left base pneumonia not excluded.  Low lung volumes.   Electronically Signed   By: Ruel Favors M.D.   On: 04/04/2013 12:44   Ct Angio Chest Aortic Dissect W &/or W/o  04/26/2013   CLINICAL DATA:  Abdominal pain/distension, ectatic thoracic aorta  EXAM: CT ANGIOGRAPHY CHEST, ABDOMEN AND PELVIS  TECHNIQUE:  Multidetector CT imaging through the chest, abdomen and pelvis was performed using the standard protocol during bolus administration of intravenous contrast. Multiplanar reconstructed images including  MIPs were obtained and reviewed to evaluate the vascular anatomy.  CONTRAST:  OMNIPAQUE IOHEXOL 350 MG/ML SOLN  COMPARISON:  None.  FINDINGS: CTA CHEST FINDINGS  On unenhanced imaging, there is no evidence of intramural hematoma. No evidence of thoracic aortic aneurysm or dissection. Although not tailored for evaluation of the pulmonary arteries, there is no evidence of pulmonary embolism.  Mild patchy opacities in the posterior right lower lobe (series 11/ image 20) and bilateral lower lobes (series 11/ image 40), suspicious for multifocal pneumonia, likely on the basis of aspiration.  Additional 6 mm subpleural nodule in the left lower lobe (series 11/ image 34). No pleural effusion or pneumothorax.  Visualized thyroid is mildly heterogeneous/nodular.  The heart is normal in size. No pericardial effusion. Atherosclerotic calcifications of the aortic arch.  Dilated, fluid-filled esophagus.  No suspicious mediastinal, hilar, or axillary lymphadenopathy.  Mild degenerative changes of the thoracic spine.  Review of the MIP images confirms the above findings.  CTA ABDOMEN AND PELVIS FINDINGS  No evidence of abdominal aortic aneurysm or dissection.  Distended, fluid-filled stomach.  Liver, pancreas, and bilateral adrenal glands are within normal limits.  Lobular contour of the spleen with peripheral calcifications (series 5/image 83), grossly unchanged, possibly reflecting sequela of prior trauma.  Gallbladder is unremarkable. No intrahepatic or extrahepatic ductal dilatation.  9 mm lateral interpolar left renal cyst (series 5/image 107). Right kidney is unremarkable. No hydronephrosis.  Multiple dilated loops of proximal small bowel. Associated debris within distal small bowel in the right lower quadrant (series 5/image 177), leading to a small bowel containing right femoral hernia (series 5/images 180 and 189). Abrupt transition as distal small bowel exits the hernia (series 5/image 178). Colon is largely  decompressed. Colonic diverticulosis, without associated inflammatory changes.  Atherosclerotic calcifications of the abdominal aorta and branch vessels.  No evidence of abdominopelvic ascites.  No suspicious abdominopelvic lymphadenopathy.  Prostatomegaly, with enlargement of the central gland which indents the base of the bladder, likely reflecting benign prostatic hyperplasia.  Bladder is within normal limits.  Small fat containing left inguinal hernia (series 5/ image 191).  Degenerative changes of the lumbar spine.  Review of the MIP images confirms the above findings.  IMPRESSION: No evidence of thoracoabdominal aortic aneurysm or dissection. No evidence of pulmonary embolism.  Small bowel obstruction on the basis of a right femoral hernia. Associated distended stomach with dilated, fluid-filled esophagus.  Multifocal patchy pulmonary opacities, suspicious for aspiration pneumonia.  6 mm subpleural left lower lobe pulmonary nodule. Assuming high risk for primary bronchogenic neoplasm, initial follow-up CT chest is suggested in 6-12 months.  Numerous additional ancillary findings as above.  This recommendation follows the consensus statement: Guidelines for Management of Small Pulmonary Nodules Detected on CT Scans: A Statement from the Fleischner Society as published in Radiology 2005; 237:395-400.  These results were called by telephone at the time of interpretation on 05/05/13 at 2:26 PM to Dr. Toy Cookey, who verbally acknowledged these results.   Electronically Signed   By: Charline Bills M.D.   On: May 05, 2013 14:40    Review of Systems  Constitutional: Negative for fever and chills.  Respiratory: Positive for shortness of breath. Negative for cough and sputum production.   Cardiovascular: Negative for chest pain.  Gastrointestinal: Positive for nausea, vomiting and abdominal pain.  Genitourinary: Negative for dysuria.  Neurological:  Negative for headaches.    Blood pressure 100/59,  pulse 115, temperature 98.8 F (37.1 C), temperature source Oral, resp. rate 40, weight 155 lb (70.308 kg), SpO2 88.00%. Physical Exam  Constitutional: He is oriented to person, place, and time.  HENT:  Head: Normocephalic and atraumatic.  Eyes: Conjunctivae are normal. Pupils are equal, round, and reactive to light.  Neck: Normal range of motion.  Cardiovascular: Regular rhythm.   tachycardic  Respiratory: Effort normal and breath sounds normal.  GI: Soft. Bowel sounds are normal. He exhibits distension. There is no tenderness. There is no rebound and no guarding.  R ing/fem hernia palpated, mildly tender  Musculoskeletal: Normal range of motion.  Neurological: He is alert and oriented to person, place, and time.  Skin: Skin is warm and dry. He is not diaphoretic.     Assessment/Plan ELDRED SOOY is a 79 y.o. M who now appears to have an incarcerated femoral hernia with associated SIRS from his SBO.  Given his overall picture, I think he requires emergent surgery to repair the defect.  He understands that we may have to perform a small bowel resection, if his bowel is compromised.  He also understands that we may not be able to place mesh due to infection risk, which would significantly increase his risk of recurrence.  We would also not do the hydrocele repair that was scheduled during his original surgery next week.  Other risks include bleeding, infection, damage to adjacent structures, pulmonary failure, renal failure, prolonged ileus and generalized risks of anesthesia.  I believe he understands these risks and has agreed to proceed with surgery.    Greenley Martone C. May 12, 2013, 3:31 PM

## 2013-04-15 NOTE — ED Notes (Signed)
Bed: WU98 Expected date: 05/01/2013 Expected time: 10:06 AM Means of arrival: Ambulance Comments: constipation

## 2013-04-15 NOTE — ED Notes (Signed)
MD at bedside. 

## 2013-04-15 NOTE — ED Notes (Signed)
Main Lab called for a phlebotomist to stick for labs.

## 2013-04-15 NOTE — ED Provider Notes (Signed)
CSN: [id number]     Arrival date & time 24-Apr-2013  1012 History   First MD Initiated Contact with Patient 04/24/13 1025     Chief Complaint  Patient presents with  . Constipation  . Abdominal Pain   (Consider location/radiation/quality/duration/timing/severity/associated sxs/prior Treatment) Patient is a 79 y.o. male presenting with abdominal pain. The history is provided by the patient. No language interpreter was used.  Abdominal Pain Pain location:  Generalized Pain quality: aching   Pain radiates to:  Does not radiate Pain severity:  Severe Onset quality:  Gradual Duration:  3 days Timing:  Constant Progression:  Worsening Relieved by:  Nothing Worsened by:  Nothing tried Ineffective treatments: PR phenergan. Associated symptoms: anorexia, constipation, nausea and vomiting   Associated symptoms: no chest pain, no cough, no diarrhea, no dysuria, no fatigue, no fever, no flatus, no melena and no shortness of breath   Nausea:    Severity:  Severe   Duration:  3 days   Timing:  Constant   Progression:  Partially resolved Vomiting:    Number of occurrences:  Many   Severity:  Severe   Duration:  2 days   Progression:  Improving Risk factors: being elderly and multiple surgeries     Past Medical History  Diagnosis Date  . Hypertension    Past Surgical History  Procedure Laterality Date  . Inguinal hernia repair Right 25 years ago  . Eye surgery    . Spine surgery  15 years ago   Family History  Problem Relation Age of Onset  . Cancer Father    History  Substance Use Topics  . Smoking status: Former Smoker    Quit date: 04/04/1973  . Smokeless tobacco: Not on file  . Alcohol Use: Yes     Comment: wine occ    Review of Systems  Constitutional: Negative for fever, activity change, appetite change and fatigue.  HENT: Negative for congestion, facial swelling, rhinorrhea and trouble swallowing.   Eyes: Negative for photophobia and pain.  Respiratory: Negative  for cough, chest tightness and shortness of breath.   Cardiovascular: Negative for chest pain and leg swelling.  Gastrointestinal: Positive for nausea, vomiting, abdominal pain, constipation and anorexia. Negative for diarrhea, melena and flatus.  Endocrine: Negative for polydipsia and polyuria.  Genitourinary: Negative for dysuria, urgency, decreased urine volume and difficulty urinating.  Musculoskeletal: Negative for back pain and gait problem.  Skin: Negative for color change, rash and wound.  Allergic/Immunologic: Negative for immunocompromised state.  Neurological: Negative for dizziness, facial asymmetry, speech difficulty, weakness, numbness and headaches.  Psychiatric/Behavioral: Negative for confusion, decreased concentration and agitation.    Allergies  Vicodin  Home Medications   Current Outpatient Rx  Name  Route  Sig  Dispense  Refill  . amLODipine (NORVASC) 10 MG tablet   Oral   Take 10 mg by mouth daily. One half tab daily         . beta carotene w/minerals (OCUVITE) tablet   Oral   Take 1 tablet by mouth daily.         . cholecalciferol (VITAMIN D) 1000 UNITS tablet   Oral   Take 1,000 Units by mouth daily.         . hydrochlorothiazide (HYDRODIURIL) 25 MG tablet   Oral   Take 25 mg by mouth daily.         Marland Kitchen losartan (COZAAR) 100 MG tablet   Oral   Take 100 mg by mouth daily.         Marland Kitchen  ondansetron (ZOFRAN ODT) 4 MG disintegrating tablet      4mg  ODT q4 hours prn nausea/vomit   8 tablet   0    BP 100/59  Pulse 115  Temp(Src) 98.8 F (37.1 C) (Oral)  Resp 40  Wt 155 lb (70.308 kg)  SpO2 88% Physical Exam  Constitutional: He is oriented to person, place, and time. He appears well-developed and well-nourished. No distress.  HENT:  Head: Normocephalic and atraumatic.  Mouth/Throat: No oropharyngeal exudate.  Eyes: Pupils are equal, round, and reactive to light.  Neck: Normal range of motion. Neck supple.  Cardiovascular: Regular rhythm  and normal heart sounds.  Tachycardia present.  Exam reveals no gallop and no friction rub.   No murmur heard. Pulmonary/Chest: Effort normal and breath sounds normal. No respiratory distress. He has no wheezes. He has no rales.  Abdominal: He exhibits distension. He exhibits no mass. Bowel sounds are decreased. There is tenderness (over R inguinal hernia). There is no rigidity, no rebound and no guarding. A hernia is present. Hernia confirmed positive in the right inguinal area.  Genitourinary: Right testis shows swelling.  Large R nontender hydrocele  Musculoskeletal: Normal range of motion. He exhibits no edema and no tenderness.  Neurological: He is alert and oriented to person, place, and time.  Skin: Skin is warm and dry.  Psychiatric: He has a normal mood and affect.    ED Course  Procedures (including critical care time) Labs Review Labs Reviewed  CBC WITH DIFFERENTIAL - Abnormal; Notable for the following:    WBC 15.9 (*)    RDW 16.0 (*)    Neutrophils Relative % 84 (*)    Neutro Abs 13.3 (*)    Lymphocytes Relative 3 (*)    Lymphs Abs 0.5 (*)    Monocytes Relative 13 (*)    Monocytes Absolute 2.1 (*)    All other components within normal limits  COMPREHENSIVE METABOLIC PANEL - Abnormal; Notable for the following:    Sodium 130 (*)    Chloride 87 (*)    Glucose, Bld 195 (*)    BUN 44 (*)    Creatinine, Ser 1.41 (*)    Total Bilirubin 1.3 (*)    GFR calc non Af Amer 44 (*)    GFR calc Af Amer 51 (*)    All other components within normal limits  LACTIC ACID, PLASMA - Abnormal; Notable for the following:    Lactic Acid, Venous 7.0 (*)    All other components within normal limits  URINALYSIS, ROUTINE W REFLEX MICROSCOPIC - Abnormal; Notable for the following:    Color, Urine AMBER (*)    APPearance CLOUDY (*)    All other components within normal limits  URINE CULTURE  TROPONIN I  PRO B NATRIURETIC PEPTIDE   Imaging Review Ct Angio Abdomen W/cm &/or Wo  Contrast  04/14/2013   CLINICAL DATA:  Abdominal pain/distension, ectatic thoracic aorta  EXAM: CT ANGIOGRAPHY CHEST, ABDOMEN AND PELVIS  TECHNIQUE: Multidetector CT imaging through the chest, abdomen and pelvis was performed using the standard protocol during bolus administration of intravenous contrast. Multiplanar reconstructed images including MIPs were obtained and reviewed to evaluate the vascular anatomy.  CONTRAST:  OMNIPAQUE IOHEXOL 350 MG/ML SOLN  COMPARISON:  None.  FINDINGS: CTA CHEST FINDINGS  On unenhanced imaging, there is no evidence of intramural hematoma. No evidence of thoracic aortic aneurysm or dissection. Although not tailored for evaluation of the pulmonary arteries, there is no evidence of pulmonary embolism.  Mild  patchy opacities in the posterior right lower lobe (series 11/ image 20) and bilateral lower lobes (series 11/ image 40), suspicious for multifocal pneumonia, likely on the basis of aspiration.  Additional 6 mm subpleural nodule in the left lower lobe (series 11/ image 34). No pleural effusion or pneumothorax.  Visualized thyroid is mildly heterogeneous/nodular.  The heart is normal in size. No pericardial effusion. Atherosclerotic calcifications of the aortic arch.  Dilated, fluid-filled esophagus.  No suspicious mediastinal, hilar, or axillary lymphadenopathy.  Mild degenerative changes of the thoracic spine.  Review of the MIP images confirms the above findings.  CTA ABDOMEN AND PELVIS FINDINGS  No evidence of abdominal aortic aneurysm or dissection.  Distended, fluid-filled stomach.  Liver, pancreas, and bilateral adrenal glands are within normal limits.  Lobular contour of the spleen with peripheral calcifications (series 5/image 83), grossly unchanged, possibly reflecting sequela of prior trauma.  Gallbladder is unremarkable. No intrahepatic or extrahepatic ductal dilatation.  9 mm lateral interpolar left renal cyst (series 5/image 107). Right kidney is unremarkable.  No hydronephrosis.  Multiple dilated loops of proximal small bowel. Associated debris within distal small bowel in the right lower quadrant (series 5/image 177), leading to a small bowel containing right femoral hernia (series 5/images 180 and 189). Abrupt transition as distal small bowel exits the hernia (series 5/image 178). Colon is largely decompressed. Colonic diverticulosis, without associated inflammatory changes.  Atherosclerotic calcifications of the abdominal aorta and branch vessels.  No evidence of abdominopelvic ascites.  No suspicious abdominopelvic lymphadenopathy.  Prostatomegaly, with enlargement of the central gland which indents the base of the bladder, likely reflecting benign prostatic hyperplasia.  Bladder is within normal limits.  Small fat containing left inguinal hernia (series 5/ image 191).  Degenerative changes of the lumbar spine.  Review of the MIP images confirms the above findings.  IMPRESSION: No evidence of thoracoabdominal aortic aneurysm or dissection. No evidence of pulmonary embolism.  Small bowel obstruction on the basis of a right femoral hernia. Associated distended stomach with dilated, fluid-filled esophagus.  Multifocal patchy pulmonary opacities, suspicious for aspiration pneumonia.  6 mm subpleural left lower lobe pulmonary nodule. Assuming high risk for primary bronchogenic neoplasm, initial follow-up CT chest is suggested in 6-12 months.  Numerous additional ancillary findings as above.  This recommendation follows the consensus statement: Guidelines for Management of Small Pulmonary Nodules Detected on CT Scans: A Statement from the Fleischner Society as published in Radiology 2005; 237:395-400.  These results were called by telephone at the time of interpretation on 04/06/2013 at 2:26 PM to Dr. Toy Cookey, who verbally acknowledged these results.   Electronically Signed   By: Charline Bills M.D.   On: 04/21/2013 14:40   Dg Chest Portable 1 View  04/26/2013    CLINICAL DATA:  Hypoxia, abdominal pain  EXAM: PORTABLE CHEST - 1 VIEW  COMPARISON:  None.  FINDINGS: Low volume exam with bibasilar opacities/atelectasis, worse in the left lower lobe. Difficult to completely exclude left base pneumonia. No edema or effusion. No pneumothorax. Ectatic thoracic aorta. Normal heart size and vascularity.  IMPRESSION: Bibasilar mild opacities and atelectasis, worse in the left lower lobe. Left base pneumonia not excluded.  Low lung volumes.   Electronically Signed   By: Ruel Favors M.D.   On: 04/14/2013 12:44   Ct Angio Chest Aortic Dissect W &/or W/o  04/29/2013   CLINICAL DATA:  Abdominal pain/distension, ectatic thoracic aorta  EXAM: CT ANGIOGRAPHY CHEST, ABDOMEN AND PELVIS  TECHNIQUE: Multidetector CT imaging through the chest,  abdomen and pelvis was performed using the standard protocol during bolus administration of intravenous contrast. Multiplanar reconstructed images including MIPs were obtained and reviewed to evaluate the vascular anatomy.  CONTRAST:  OMNIPAQUE IOHEXOL 350 MG/ML SOLN  COMPARISON:  None.  FINDINGS: CTA CHEST FINDINGS  On unenhanced imaging, there is no evidence of intramural hematoma. No evidence of thoracic aortic aneurysm or dissection. Although not tailored for evaluation of the pulmonary arteries, there is no evidence of pulmonary embolism.  Mild patchy opacities in the posterior right lower lobe (series 11/ image 20) and bilateral lower lobes (series 11/ image 40), suspicious for multifocal pneumonia, likely on the basis of aspiration.  Additional 6 mm subpleural nodule in the left lower lobe (series 11/ image 34). No pleural effusion or pneumothorax.  Visualized thyroid is mildly heterogeneous/nodular.  The heart is normal in size. No pericardial effusion. Atherosclerotic calcifications of the aortic arch.  Dilated, fluid-filled esophagus.  No suspicious mediastinal, hilar, or axillary lymphadenopathy.  Mild degenerative changes of the  thoracic spine.  Review of the MIP images confirms the above findings.  CTA ABDOMEN AND PELVIS FINDINGS  No evidence of abdominal aortic aneurysm or dissection.  Distended, fluid-filled stomach.  Liver, pancreas, and bilateral adrenal glands are within normal limits.  Lobular contour of the spleen with peripheral calcifications (series 5/image 83), grossly unchanged, possibly reflecting sequela of prior trauma.  Gallbladder is unremarkable. No intrahepatic or extrahepatic ductal dilatation.  9 mm lateral interpolar left renal cyst (series 5/image 107). Right kidney is unremarkable. No hydronephrosis.  Multiple dilated loops of proximal small bowel. Associated debris within distal small bowel in the right lower quadrant (series 5/image 177), leading to a small bowel containing right femoral hernia (series 5/images 180 and 189). Abrupt transition as distal small bowel exits the hernia (series 5/image 178). Colon is largely decompressed. Colonic diverticulosis, without associated inflammatory changes.  Atherosclerotic calcifications of the abdominal aorta and branch vessels.  No evidence of abdominopelvic ascites.  No suspicious abdominopelvic lymphadenopathy.  Prostatomegaly, with enlargement of the central gland which indents the base of the bladder, likely reflecting benign prostatic hyperplasia.  Bladder is within normal limits.  Small fat containing left inguinal hernia (series 5/ image 191).  Degenerative changes of the lumbar spine.  Review of the MIP images confirms the above findings.  IMPRESSION: No evidence of thoracoabdominal aortic aneurysm or dissection. No evidence of pulmonary embolism.  Small bowel obstruction on the basis of a right femoral hernia. Associated distended stomach with dilated, fluid-filled esophagus.  Multifocal patchy pulmonary opacities, suspicious for aspiration pneumonia.  6 mm subpleural left lower lobe pulmonary nodule. Assuming high risk for primary bronchogenic neoplasm, initial  follow-up CT chest is suggested in 6-12 months.  Numerous additional ancillary findings as above.  This recommendation follows the consensus statement: Guidelines for Management of Small Pulmonary Nodules Detected on CT Scans: A Statement from the Fleischner Society as published in Radiology 2005; 237:395-400.  These results were called by telephone at the time of interpretation on 2013-05-10 at 2:26 PM to Dr. Toy Cookey, who verbally acknowledged these results.   Electronically Signed   By: Charline Bills M.D.   On: 05-10-13 14:40    EKG Interpretation    Date/Time:  Saturday May 10, 2013 12:32:14 EST Ventricular Rate:  112 PR Interval:  180 QRS Duration: 133 QT Interval:  362 QTC Calculation: 494 R Axis:   -107 Text Interpretation:  Sinus tachycardia Left axis deviation .Marland Kitchen No prior for comparison.  Confirmed by  DOCHERTY  MD, MEGAN [phone]) on 04/11/2013 12:36:35 PM Also confirmed by DOCHERTY  MD, MEGAN [phone])  on 04/21/2013 4:16:42 PM           CRITICAL CARE Performed by: Toy Cookey, E Total critical care time: 40 Critical care time was exclusive of separately billable procedures and treating other patients. Critical care was necessary to treat or prevent imminent or life-threatening deterioration. Critical care was time spent personally by me on the following activities: development of treatment plan with patient and/or surrogate as well as nursing, discussions with consultants, evaluation of patient's response to treatment, examination of patient, obtaining history from patient or surrogate, ordering and performing treatments and interventions, ordering and review of laboratory studies, ordering and review of radiographic studies, pulse oximetry and re-evaluation of patient's condition.  MDM   1. SBO (small bowel obstruction)   2. Abdominal pain   3. Femoral hernia, right   4. Sepsis   5. Aspiration pneumonia   6. Elevated lactic acid level   7. Leukocytosis    8. AKI (acute kidney injury)    Pt is a 79 y.o. male with Pmhx as above who presents with 3 days of worsening abdominal pain/chest pain, nausea, vomiting (none for 24 hrs), inability to pass stool or flatus, abdominal distention.  He reports pain at first was over R inguinal hernia, though now is generalized.  He deneis fever, cough, SOB. On exam, pt uncomfortable appearing, tachycardic.  Abdomen is distended, typanitic, with decreased bowel sounds.  +ttp over R inguinal hernia which is not reproducible (pt states it is never reducible). Small amt of stool in vault.  Abdominal pain is not reproducible and states that pain is not over chest when I point out chest.  He also has non-tender R hydrocele.   CXR shows ectatic thoracic aorta w/o comparison, opacities/atelectasis at bases, LA 7, Cr 1.4, WBC 15.9.  Given abnml CXR and initial complaint of CP, have changed CT ab/pelvis to dissection study.  I have explained the risk/benefits of additional IV contrast to pt & wife.  Given his worsening status, I feel it is a necessary risk. 1L NS given.  Have also ordered vanc/zosyn as pt meets SIRS criteria and I am unsure if source is abdomen or lungs.   2:26 PM Pt found to have no evidence of thoracic aneurysm or PE, but does have SBO originating from R femoral hernia, multifocal pulmonary opacities concerning for aspiration pna.  Pt has had gradually worsening pulm status w/ development of crackles at bases and increasing O2 requirement from RA to 4L by [place]. I have concern for development of ARDS given timing of O2 requirement.  2:52 PM Have spoken to both Gen Surg & critical care regarding pt including his worsening condition, inc O2 requirement and my concern he may be developing ARDS.  Gen surg requesting Crit care to admit, does not feel pt would be surgical, but will come see in ED as requested.  Crit care would like surgery to consult before taking to ICU in case he requires emergent surgery. Pt still  mentating well, though continues to have worsening respiratory status.  15:53PM Pt taken or OR w/ gen surg.   Shanna Cisco, MD 04/08/2013 [phone]

## 2013-04-15 NOTE — Anesthesia Postprocedure Evaluation (Signed)
  Anesthesia Post-op Note  Patient: Casey Davila  Procedure(s) Performed: Procedure(s): HERNIA REPAIR INGUINAL ADULT (Right)  Patient Location: PACU and ICU  Anesthesia Type:General  Level of Consciousness: sedated  Airway and Oxygen Therapy: Patient placed on Ventilator (see vital sign flow sheet for setting)  Post-op Pain: unable to assess  Post-op Assessment: Post-op Vital signs reviewed  Post-op Vital Signs: Reviewed  Complications: aspiration prior to and on induction

## 2013-04-15 NOTE — Op Note (Signed)
04/24/2013  7:14 PM  PATIENT:  Casey Davila  79 y.o. male  Patient Care Team: Minda Meo, MD as PCP - General (Internal Medicine)  PRE-OPERATIVE DIAGNOSIS:  Right femoral hernia repair  POST-OPERATIVE DIAGNOSIS:  Right femoral hernia repair  PROCEDURE: HERNIA REPAIR Femoral ADULT Diagnostic Laparoscopy   SURGEON:  Surgeon(s): Romie Levee, MD Cherylynn Ridges, MD  ASSISTANT: Lindie Spruce    ANESTHESIA:   general  EBL:     DRAINS: none   SPECIMEN:  Source of Specimen:  hernia sac  DISPOSITION OF SPECIMEN:  PATHOLOGY  COUNTS:  YES  PLAN OF CARE: Admit to inpatient   PATIENT DISPOSITION:  ICU - intubated and hemodynamically stable.  INDICATION: This is 79 year old male that is known to our practice due to a femoral hernia. He was scheduled for surgery next week. He presented to the emergency department with worsening constipation and nausea and vomiting. CT scan showed a loop of small bowel incarcerated into a femoral hernia with small bowel obstruction. CT scan also showed concerns for aspiration pneumonia.   OR FINDINGS: Incarcerated femoral hernia, viable small bowel  DESCRIPTION: the patient was identified in the preoperative holding area and taken to the OR where they were laid Supine on the operating room table.  Gen. anesthesia was induced.  There was some GI contents noted at the cords prior to intubation. SCDs were also noted to be in place prior to the initiation of anesthesia.  A Foley catheter was inserted under sterile conditions.  The patient was then prepped and draped in the usual sterile fashion.   A surgical timeout was performed indicating the correct patient, procedure, positioning and need for preoperative antibiotics.   I began by making an incision through his old hernia repair scar using a 10 blade scalpel. Dissection was carried down through the subcutaneous tissues until the hernia was identified. The hernia was dissected free from surrounding  structures and determined to be below the inguinal ligament just medial to the femoral artery and vein. This confirmed a femoral hernia. I opened the hernia sac. There were no signs of bowel noted that there was some cloudy, murky-appearing fluid.  He did not have any signs of recurrent inguinal hernia. We identified the cord structures and placed a Penrose drain around these. We then attempted to dissect the cord structures from the previously placed mesh to get down to the inguinal floor and Cooper's ligament. We slowly dissected down to this area transecting the mesh as we went. We then mobilized the external oblique fascia as much as we could. The pubic tubercle was easily identified. Once we were down to Cooper's ligament the shelving border and mesh was sutured to this using interrupted 0 Prolene sutures. We brought this out from the pubic tubercle laterally to the femoral vein. We then released the Penrose drain and placed the cord structures back into the appropriate position. I closed Scarpa's fascia around the cord structures Using a running 2-0 Vicryl suture. The subcutaneous tissue was closed using interrupted Vicryl sutures and the skin was closed using staples. A sterile dressing was applied.  Even in the cloudy fluid that was encountered in the hernia sac I was concerned that a necrotic portion of the small bowel had dropped into the abdomen. I decided to perform a laparoscopy to evaluate this. Entry was gained into the abdomen using an Optiview 5 mm trocar. The abdomen was insufflated to approximately 15 mm of mercury. The abdomen was inspected. There was no  signs of injury upon entry. I then placed 25 mm ports in the left lower quadrant for laparoscopic graspers to manipulate the bowel. I identified the transition point. This was obvious as there was dilated bowel proximally and decompressed bowel distally. The transition point appeared viable with no signs of necrosis. I ran the bowel all the way  back to the terminal ileum. There was no other signs of obstruction. The obstructing bowel was evaluated proximally to the ligament of Treitz. At this point the abdomen was desufflated. The port sites were closed using 4-0 Vicryl and Dermabond. The patient remained intubated and sent to the ICU do to his aspiration pneumonia and hypoxia. All counts were correct per operating room staff.

## 2013-04-15 NOTE — ED Notes (Signed)
Unsuccessful attempt at IV. RN Marchia Bond to try.

## 2013-04-15 NOTE — Consult Note (Signed)
PULMONARY  / CRITICAL CARE MEDICINE  Name: Casey Davila MRN: [id number] DOB: 07/03/1925    ADMISSION DATE:  04/29/2013 CONSULTATION DATE:  04/09/2013  REFERRING MD :  Romie Levee, MD PRIMARY SERVICE: Surgery  CHIEF COMPLAINT:   Post repair of incarcerated femoral hernia. Out of the OR intubated.   BRIEF PATIENT DESCRIPTION:  79 years old male with PMH relevant for HTN.  Underwent emergent repair and now out of the OR intubated in the ICU. CCM consult called to assist with ventilator management.   SIGNIFICANT EVENTS / STUDIES:    LINES / TUBES: - Peripheral IV's - ETT - Foley catheter.   CULTURES: No cultures ordered  ANTIBIOTICS: - Zosyn per primary team - Vancomycin per primary team  HISTORY OF PRESENT ILLNESS:   79 years old male with PMH relevant for HTN. Presents with abdominal pain and nausea. Known to have inguinal hernia. Found to have incarcerated right femoral hernia. Underwent emergent repair and now out of the OR intubated in the ICU. CCM consult called to assist with ventilator management. At the time of intubation he was found to have gastric contents in his vocal cords and as per the op note he was hypoxic.  He was found to have incarcerated hernia with viable bowelAt the time of my examination the patient is intubated, sedated, on minimal vent settings, saturating 100% on 40% FiO2 and hemodynamically stable.   PAST MEDICAL HISTORY :  Past Medical History  Diagnosis Date  . Hypertension    Past Surgical History  Procedure Laterality Date  . Inguinal hernia repair Right 25 years ago  . Eye surgery    . Spine surgery  15 years ago   Prior to Admission medications   Medication Sig Start Date End Date Taking? Authorizing Provider  amLODipine (NORVASC) 10 MG tablet Take 10 mg by mouth daily. One half tab daily   Yes Historical Provider, MD  beta carotene w/minerals (OCUVITE) tablet Take 1 tablet by mouth daily.   Yes Historical Provider, MD   cholecalciferol (VITAMIN D) 1000 UNITS tablet Take 1,000 Units by mouth daily.   Yes Historical Provider, MD  hydrochlorothiazide (HYDRODIURIL) 25 MG tablet Take 25 mg by mouth daily.   Yes Historical Provider, MD  losartan (COZAAR) 100 MG tablet Take 100 mg by mouth daily.   Yes Historical Provider, MD  ondansetron (ZOFRAN ODT) 4 MG disintegrating tablet 4mg  ODT q4 hours prn nausea/vomit 04/13/13  Yes Rudene Anda, PA-C   Allergies  Allergen Reactions  . Vicodin [Hydrocodone-Acetaminophen] Nausea And Vomiting    FAMILY HISTORY:  Family History  Problem Relation Age of Onset  . Cancer Father    SOCIAL HISTORY:  reports that he quit smoking about 40 years ago. He does not have any smokeless tobacco history on file. He reports that he drinks alcohol. He reports that he does not use illicit drugs.  REVIEW OF SYSTEMS:  Unable to obtain.   SUBJECTIVE:   VITAL SIGNS: Temp:  [97.8 F (36.6 C)-98.8 F (37.1 C)] 98.8 F (37.1 C) (12/13 1112) Pulse Rate:  [60-118] 115 (12/13 1444) Resp:  [18-40] 40 (12/13 1515) BP: (100-154)/(53-85) 100/59 mmHg (12/13 1515) SpO2:  [88 %-98 %] 88 % (12/13 1444) FiO2 (%):  [100 %] 100 % (12/13 1927) Weight:  [155 lb (70.308 kg)] 155 lb (70.308 kg) (12/13 1409) HEMODYNAMICS:   VENTILATOR SETTINGS: Vent Mode:  [-] PRVC FiO2 (%):  [100 %] 100 % Set Rate:  [14 bmp] 14 bmp Vt Set:  [  600 mL] 600 mL PEEP:  [5 cmH20] 5 cmH20 INTAKE / OUTPUT: Intake/Output     12/13 0701 - 12/14 0700   I.V. (mL/kg) 2250 (32)   Total Intake(mL/kg) 2250 (32)   Urine (mL/kg/hr) 200   Other 2250   Blood 75   Total Output 2525   Net -275         PHYSICAL EXAMINATION: General: Sedated, intubated, no acute distress. Eyes: Anicteric sclerae. Pupils are ENT: ETT in place. Trachea at midline.  Lymph: No cervical, supraclavicular, or axillary lymphadenopathy. Heart: Normal S1, S2. No murmurs, rubs, or gallops appreciated. No bruits, equal pulses. Lungs: Normal  excursion, no dullness to percussion. Good air movement bilaterally, without wheezes or crackles.  Abdomen: Abdomen soft, non-tender and not distended, normoactive bowel sounds. No hepatosplenomegaly or masses. Musculoskeletal: No clubbing or synovitis. No LE edema Skin: No rashes or lesions Neuro: Patient is unresponsive.   LABS:  CBC  Recent Labs Lab 04/13/13 0025 04/24/13 1235  WBC 4.7 15.9*  HGB 11.6* 14.2  HCT 34.3* 41.2  PLT 196 224   Coag's No results found for this basename: APTT, INR,  in the last 168 hours BMET  Recent Labs Lab 04/13/13 0025 04-24-13 1235  NA 128* 130*  K 3.7 3.5  CL 88* 87*  CO2 25 21  BUN 17 44*  CREATININE 0.97 1.41*  GLUCOSE 109* 195*   Electrolytes  Recent Labs Lab 04/13/13 0025 Apr 24, 2013 1235  CALCIUM 9.2 9.9   Sepsis Markers  Recent Labs Lab 04/13/13 0044 2013/04/24 1235  LATICACIDVEN 1.36 7.0*   ABG No results found for this basename: PHART, PCO2ART, PO2ART,  in the last 168 hours Liver Enzymes  Recent Labs Lab 24-Apr-2013 1235  AST 28  ALT 19  ALKPHOS 68  BILITOT 1.3*  ALBUMIN 3.7   Cardiac Enzymes  Recent Labs Lab 2013/04/24 1235  TROPONINI <0.30  PROBNP 448.0   Glucose No results found for this basename: GLUCAP,  in the last 168 hours  Imaging Ct Angio Abdomen W/cm &/or Wo Contrast  2013-04-24   CLINICAL DATA:  Abdominal pain/distension, ectatic thoracic aorta  EXAM: CT ANGIOGRAPHY CHEST, ABDOMEN AND PELVIS  TECHNIQUE: Multidetector CT imaging through the chest, abdomen and pelvis was performed using the standard protocol during bolus administration of intravenous contrast. Multiplanar reconstructed images including MIPs were obtained and reviewed to evaluate the vascular anatomy.  CONTRAST:  OMNIPAQUE IOHEXOL 350 MG/ML SOLN  COMPARISON:  None.  FINDINGS: CTA CHEST FINDINGS  On unenhanced imaging, there is no evidence of intramural hematoma. No evidence of thoracic aortic aneurysm or dissection. Although  not tailored for evaluation of the pulmonary arteries, there is no evidence of pulmonary embolism.  Mild patchy opacities in the posterior right lower lobe (series 11/ image 20) and bilateral lower lobes (series 11/ image 40), suspicious for multifocal pneumonia, likely on the basis of aspiration.  Additional 6 mm subpleural nodule in the left lower lobe (series 11/ image 34). No pleural effusion or pneumothorax.  Visualized thyroid is mildly heterogeneous/nodular.  The heart is normal in size. No pericardial effusion. Atherosclerotic calcifications of the aortic arch.  Dilated, fluid-filled esophagus.  No suspicious mediastinal, hilar, or axillary lymphadenopathy.  Mild degenerative changes of the thoracic spine.  Review of the MIP images confirms the above findings.  CTA ABDOMEN AND PELVIS FINDINGS  No evidence of abdominal aortic aneurysm or dissection.  Distended, fluid-filled stomach.  Liver, pancreas, and bilateral adrenal glands are within normal limits.  Lobular contour of  the spleen with peripheral calcifications (series 5/image 83), grossly unchanged, possibly reflecting sequela of prior trauma.  Gallbladder is unremarkable. No intrahepatic or extrahepatic ductal dilatation.  9 mm lateral interpolar left renal cyst (series 5/image 107). Right kidney is unremarkable. No hydronephrosis.  Multiple dilated loops of proximal small bowel. Associated debris within distal small bowel in the right lower quadrant (series 5/image 177), leading to a small bowel containing right femoral hernia (series 5/images 180 and 189). Abrupt transition as distal small bowel exits the hernia (series 5/image 178). Colon is largely decompressed. Colonic diverticulosis, without associated inflammatory changes.  Atherosclerotic calcifications of the abdominal aorta and branch vessels.  No evidence of abdominopelvic ascites.  No suspicious abdominopelvic lymphadenopathy.  Prostatomegaly, with enlargement of the central gland which  indents the base of the bladder, likely reflecting benign prostatic hyperplasia.  Bladder is within normal limits.  Small fat containing left inguinal hernia (series 5/ image 191).  Degenerative changes of the lumbar spine.  Review of the MIP images confirms the above findings.  IMPRESSION: No evidence of thoracoabdominal aortic aneurysm or dissection. No evidence of pulmonary embolism.  Small bowel obstruction on the basis of a right femoral hernia. Associated distended stomach with dilated, fluid-filled esophagus.  Multifocal patchy pulmonary opacities, suspicious for aspiration pneumonia.  6 mm subpleural left lower lobe pulmonary nodule. Assuming high risk for primary bronchogenic neoplasm, initial follow-up CT chest is suggested in 6-12 months.  Numerous additional ancillary findings as above.  This recommendation follows the consensus statement: Guidelines for Management of Small Pulmonary Nodules Detected on CT Scans: A Statement from the Fleischner Society as published in Radiology 2005; 237:395-400.  These results were called by telephone at the time of interpretation on 04/29/2013 at 2:26 PM to Dr. Toy Cookey, who verbally acknowledged these results.   Electronically Signed   By: Charline Bills M.D.   On: 04/16/2013 14:40   Dg Chest Portable 1 View  04/22/2013   CLINICAL DATA:  Hypoxia, abdominal pain  EXAM: PORTABLE CHEST - 1 VIEW  COMPARISON:  None.  FINDINGS: Low volume exam with bibasilar opacities/atelectasis, worse in the left lower lobe. Difficult to completely exclude left base pneumonia. No edema or effusion. No pneumothorax. Ectatic thoracic aorta. Normal heart size and vascularity.  IMPRESSION: Bibasilar mild opacities and atelectasis, worse in the left lower lobe. Left base pneumonia not excluded.  Low lung volumes.   Electronically Signed   By: Ruel Favors M.D.   On: 04/25/2013 12:44   Ct Angio Chest Aortic Dissect W &/or W/o  04/25/2013   CLINICAL DATA:  Abdominal  pain/distension, ectatic thoracic aorta  EXAM: CT ANGIOGRAPHY CHEST, ABDOMEN AND PELVIS  TECHNIQUE: Multidetector CT imaging through the chest, abdomen and pelvis was performed using the standard protocol during bolus administration of intravenous contrast. Multiplanar reconstructed images including MIPs were obtained and reviewed to evaluate the vascular anatomy.  CONTRAST:  OMNIPAQUE IOHEXOL 350 MG/ML SOLN  COMPARISON:  None.  FINDINGS: CTA CHEST FINDINGS  On unenhanced imaging, there is no evidence of intramural hematoma. No evidence of thoracic aortic aneurysm or dissection. Although not tailored for evaluation of the pulmonary arteries, there is no evidence of pulmonary embolism.  Mild patchy opacities in the posterior right lower lobe (series 11/ image 20) and bilateral lower lobes (series 11/ image 40), suspicious for multifocal pneumonia, likely on the basis of aspiration.  Additional 6 mm subpleural nodule in the left lower lobe (series 11/ image 34). No pleural effusion or pneumothorax.  Visualized  thyroid is mildly heterogeneous/nodular.  The heart is normal in size. No pericardial effusion. Atherosclerotic calcifications of the aortic arch.  Dilated, fluid-filled esophagus.  No suspicious mediastinal, hilar, or axillary lymphadenopathy.  Mild degenerative changes of the thoracic spine.  Review of the MIP images confirms the above findings.  CTA ABDOMEN AND PELVIS FINDINGS  No evidence of abdominal aortic aneurysm or dissection.  Distended, fluid-filled stomach.  Liver, pancreas, and bilateral adrenal glands are within normal limits.  Lobular contour of the spleen with peripheral calcifications (series 5/image 83), grossly unchanged, possibly reflecting sequela of prior trauma.  Gallbladder is unremarkable. No intrahepatic or extrahepatic ductal dilatation.  9 mm lateral interpolar left renal cyst (series 5/image 107). Right kidney is unremarkable. No hydronephrosis.  Multiple dilated loops of  proximal small bowel. Associated debris within distal small bowel in the right lower quadrant (series 5/image 177), leading to a small bowel containing right femoral hernia (series 5/images 180 and 189). Abrupt transition as distal small bowel exits the hernia (series 5/image 178). Colon is largely decompressed. Colonic diverticulosis, without associated inflammatory changes.  Atherosclerotic calcifications of the abdominal aorta and branch vessels.  No evidence of abdominopelvic ascites.  No suspicious abdominopelvic lymphadenopathy.  Prostatomegaly, with enlargement of the central gland which indents the base of the bladder, likely reflecting benign prostatic hyperplasia.  Bladder is within normal limits.  Small fat containing left inguinal hernia (series 5/ image 191).  Degenerative changes of the lumbar spine.  Review of the MIP images confirms the above findings.  IMPRESSION: No evidence of thoracoabdominal aortic aneurysm or dissection. No evidence of pulmonary embolism.  Small bowel obstruction on the basis of a right femoral hernia. Associated distended stomach with dilated, fluid-filled esophagus.  Multifocal patchy pulmonary opacities, suspicious for aspiration pneumonia.  6 mm subpleural left lower lobe pulmonary nodule. Assuming high risk for primary bronchogenic neoplasm, initial follow-up CT chest is suggested in 6-12 months.  Numerous additional ancillary findings as above.  This recommendation follows the consensus statement: Guidelines for Management of Small Pulmonary Nodules Detected on CT Scans: A Statement from the Fleischner Society as published in Radiology 2005; 237:395-400.  These results were called by telephone at the time of interpretation on 04/29/2013 at 2:26 PM to Dr. Toy Cookey, who verbally acknowledged these results.   Electronically Signed   By: Charline Bills M.D.   On: 04/09/2013 14:40     CXR: Last X ray post OR showed worsening bilateral infiltrates R > L likely  secondary to aspiration.  ASSESSMENT / PLAN:  PULMONARY A: 1) Acute hypoxemic respiratory failure 2) Aspiration pneumonia P:   - Mechanical ventilation   - PRVC, Vt: 8cc/kg, PEEP: 5, RR: 18, FiO2: 100% and adjust to keep O2 sat > 94%   - VAP prevention order set   - Daily awakening and SBT - Agree with Zosyn / Vancomycin  CARDIOVASCULAR A:  1) Lactic acidosis secondary to incarcerated hernia P:  - Will repeat lactic acid and BMP - Will be gentle with fluids if necessary. Now on NS at 100 cc/hr   RENAL A:   1) Acute renal failure, likely pre renal P:   - Will follow BMP  GASTROINTESTINAL A:   1) Incarcerated right femoral hernia. Status post surgical repair. Viable bowel. P:   - GI prophylaxis with protonix - Rest of care per primary team  HEMATOLOGIC A:   1) No issues P:  - Will follow CBC  INFECTIOUS A:   1) Aspiration pneumonia P:   -  Zosyn - Vancomycin  ENDOCRINE A:   1) No issues    NEUROLOGIC A:   1) Intubated, sedated P:   - Sedation and pain management with propofol and fentanyl - RASS score goal of -1   I have personally obtained a history, examined the patient, evaluated laboratory and imaging results, formulated the assessment and plan and placed orders. CRITICAL CARE: Critical Care Time devoted to patient care services described in this note is 45 minutes.   Overton Mam, MD Pulmonary and Critical Care Medicine [facility] Pager: [phone]  05/01/2013, 8:17 PM

## 2013-04-15 NOTE — Transfer of Care (Signed)
Immediate Anesthesia Transfer of Care Note  Patient: Casey Davila  Procedure(s) Performed: Procedure(s) (LRB): HERNIA REPAIR INGUINAL ADULT (Right)  Patient Location: ICU  Anesthesia Type: General  Level of Consciousness: sedated, patient non-responsive.  Airway & Oxygen Therapy: Patient on ventilator and 100% oxygen.  Post-op Assessment: Report given to ICU RN and Post -op Vital signs reviewed and stable  Post vital signs: Reviewed and stable  Complications: Unable to extubate, to remain on ventilator overnight.

## 2013-04-15 NOTE — ED Notes (Signed)
Patient will call when he has to urinate 

## 2013-04-15 NOTE — Progress Notes (Signed)
  ANTIBIOTIC CONSULT NOTE - INITIAL  Pharmacy Consult for Vancomycin and Zosyn Indication: r/o abd sepsis, HCAP  Allergies  Allergen Reactions  . Vicodin [Hydrocodone-Acetaminophen] Nausea And Vomiting    Patient Measurements:     Vital Signs: Temp: 98.8 F (37.1 C) (12/13 1112) Temp src: Oral (12/13 1112) BP: 123/63 mmHg (12/13 1313) Pulse Rate: 110 (12/13 1259) Intake/Output from previous day:   Intake/Output from this shift:    Labs:  Recent Labs  04/13/13 0025 04/29/2013 1235  WBC 4.7 15.9*  HGB 11.6* 14.2  PLT 196 224  CREATININE 0.97 1.41*   The CrCl is unknown because both a height and weight (above a minimum accepted value) are required for this calculation. No results found for this basename: VANCOTROUGH, VANCOPEAK, VANCORANDOM, GENTTROUGH, GENTPEAK, GENTRANDOM, TOBRATROUGH, TOBRAPEAK, TOBRARND, AMIKACINPEAK, AMIKACINTROU, AMIKACIN,  in the last 72 hours   Microbiology: No results found for this or any previous visit (from the past 720 hour(s)).  Medical History: Past Medical History  Diagnosis Date  . Hypertension     Medications:   (Not in a hospital admission)  Assessment: 79yo M from [facility] with abdominal pain and distension, N/V, constipation, possible pneumonia on CXR. Has R inguinal hernia. Pharmacy asked to dose empiric Vanc and Zosyn.  Tmax: AF  WBCs: elevated  Renal: SCr 1.41, 39N, likely pre-renal.  Wt: 73kg on 04/04/13  No cultures orderd  Goal of Therapy:  Vancomycin trough level 15-20 mcg/ml  Plan:   Vancomycin 1g x1 then 750mg  IV q12h.  Zosyn 3.375g IV Q8H infused over 4hrs.  Measure Vanc trough at steady state.  Follow up renal fxn and culture results.  Charolotte Eke, PharmD, pager [phone]. 04/19/2013,2:02 PM.

## 2013-04-15 NOTE — ED Notes (Signed)
Respiratory Called  

## 2013-04-15 NOTE — ED Notes (Signed)
Casey Davila NT to attempt labs

## 2013-04-16 ENCOUNTER — Inpatient Hospital Stay (HOSPITAL_COMMUNITY): Payer: Medicare Other

## 2013-04-16 DIAGNOSIS — R579 Shock, unspecified: Secondary | ICD-10-CM

## 2013-04-16 DIAGNOSIS — N179 Acute kidney failure, unspecified: Secondary | ICD-10-CM | POA: Diagnosis present

## 2013-04-16 DIAGNOSIS — J690 Pneumonitis due to inhalation of food and vomit: Secondary | ICD-10-CM | POA: Diagnosis present

## 2013-04-16 DIAGNOSIS — A419 Sepsis, unspecified organism: Secondary | ICD-10-CM | POA: Diagnosis present

## 2013-04-16 DIAGNOSIS — E876 Hypokalemia: Secondary | ICD-10-CM | POA: Diagnosis not present

## 2013-04-16 DIAGNOSIS — J9600 Acute respiratory failure, unspecified whether with hypoxia or hypercapnia: Secondary | ICD-10-CM | POA: Diagnosis not present

## 2013-04-16 LAB — BLOOD GAS, ARTERIAL
Acid-base deficit: 2.7 mmol/L — ABNORMAL HIGH (ref 0.0–2.0)
Bicarbonate: 21.1 mEq/L (ref 20.0–24.0)
FIO2: 0.4 %
MECHVT: 600 mL
O2 Saturation: 87 %
Patient temperature: 99.2
TCO2: 19.8 mmol/L (ref 0–100)
pH, Arterial: 7.392 (ref 7.350–7.450)

## 2013-04-16 LAB — CBC
HCT: 30 % — ABNORMAL LOW (ref 39.0–52.0)
Hemoglobin: 10.3 g/dL — ABNORMAL LOW (ref 13.0–17.0)
MCH: 27.7 pg (ref 26.0–34.0)
MCHC: 34.3 g/dL (ref 30.0–36.0)
MCV: 80.6 fL (ref 78.0–100.0)
RBC: 3.72 MIL/uL — ABNORMAL LOW (ref 4.22–5.81)
WBC: 3.4 10*3/uL — ABNORMAL LOW (ref 4.0–10.5)

## 2013-04-16 LAB — URINE CULTURE: Colony Count: 7000

## 2013-04-16 LAB — BASIC METABOLIC PANEL
CO2: 21 mEq/L (ref 19–32)
Chloride: 101 mEq/L (ref 96–112)
Creatinine, Ser: 1.43 mg/dL — ABNORMAL HIGH (ref 0.50–1.35)
Glucose, Bld: 109 mg/dL — ABNORMAL HIGH (ref 70–99)
Sodium: 134 mEq/L — ABNORMAL LOW (ref 135–145)

## 2013-04-16 MED ORDER — AMIODARONE HCL IN DEXTROSE 360-4.14 MG/200ML-% IV SOLN
INTRAVENOUS | Status: AC
  Filled 2013-04-16: qty 200

## 2013-04-16 MED ORDER — AMIODARONE IV BOLUS ONLY 150 MG/100ML
INTRAVENOUS | Status: AC
  Administered 2013-04-16: 150 mg
  Filled 2013-04-16: qty 100

## 2013-04-16 MED ORDER — POTASSIUM CHLORIDE 20 MEQ/15ML (10%) PO LIQD
20.0000 meq | ORAL | Status: DC
  Administered 2013-04-16: 20 meq
  Filled 2013-04-16 (×2): qty 15

## 2013-04-16 MED ORDER — PHENYLEPHRINE HCL 10 MG/ML IJ SOLN
30.0000 ug/min | INTRAVENOUS | Status: DC
  Administered 2013-04-16: 135 ug/min via INTRAVENOUS
  Administered 2013-04-16: 90 ug/min via INTRAVENOUS
  Administered 2013-04-17: 60 ug/min via INTRAVENOUS
  Filled 2013-04-16 (×4): qty 4

## 2013-04-16 MED ORDER — MIDAZOLAM HCL 10 MG/2ML IJ SOLN: 4.0000 mg | Freq: Once | INTRAMUSCULAR | Status: DC

## 2013-04-16 MED ORDER — AMIODARONE LOAD VIA INFUSION
150.0000 mg | Freq: Once | INTRAVENOUS | Status: AC
  Administered 2013-04-16: 150 mg via INTRAVENOUS
  Filled 2013-04-16: qty 83.34

## 2013-04-16 MED ORDER — AMIODARONE HCL IN DEXTROSE 360-4.14 MG/200ML-% IV SOLN
30.0000 mg/h | INTRAVENOUS | Status: AC
  Administered 2013-04-16 – 2013-04-17 (×2): 30 mg/h via INTRAVENOUS
  Filled 2013-04-16 (×2): qty 200

## 2013-04-16 MED ORDER — SODIUM CHLORIDE 0.9 % IV BOLUS (SEPSIS)
500.0000 mL | Freq: Once | INTRAVENOUS | Status: AC
  Administered 2013-04-16: 500 mL via INTRAVENOUS

## 2013-04-16 MED ORDER — MIDAZOLAM HCL 2 MG/2ML IJ SOLN
4.0000 mg | Freq: Once | INTRAMUSCULAR | Status: AC
  Administered 2013-04-16: 4 mg via INTRAVENOUS

## 2013-04-16 MED ORDER — AMIODARONE HCL IN DEXTROSE 360-4.14 MG/200ML-% IV SOLN
60.0000 mg/h | INTRAVENOUS | Status: AC
  Administered 2013-04-16: 60 mg/h via INTRAVENOUS

## 2013-04-16 MED ORDER — ACETAMINOPHEN 10 MG/ML IV SOLN
1000.0000 mg | Freq: Four times a day (QID) | INTRAVENOUS | Status: AC
  Administered 2013-04-16 – 2013-04-17 (×4): 1000 mg via INTRAVENOUS
  Filled 2013-04-16 (×5): qty 100

## 2013-04-16 MED ORDER — NOREPINEPHRINE BITARTRATE 1 MG/ML IJ SOLN
2.0000 ug/min | INTRAVENOUS | Status: DC
  Administered 2013-04-16: 5 ug/min via INTRAVENOUS
  Filled 2013-04-16: qty 4

## 2013-04-16 MED ORDER — MIDAZOLAM HCL 2 MG/2ML IJ SOLN
INTRAMUSCULAR | Status: AC
  Filled 2013-04-16: qty 4

## 2013-04-16 MED ORDER — PHENYLEPHRINE HCL 10 MG/ML IJ SOLN
30.0000 ug/min | INTRAVENOUS | Status: DC
  Administered 2013-04-16: 30 ug/min via INTRAVENOUS
  Administered 2013-04-16: 150 ug/min via INTRAVENOUS
  Filled 2013-04-16 (×2): qty 1

## 2013-04-16 MED ORDER — SODIUM CHLORIDE 0.9 % IV BOLUS (SEPSIS)
1000.0000 mL | Freq: Once | INTRAVENOUS | Status: AC
Start: 2013-04-16 — End: 2013-04-16
  Administered 2013-04-16: 1000 mL via INTRAVENOUS

## 2013-04-16 MED ORDER — SODIUM CHLORIDE 0.9 % IV BOLUS (SEPSIS)
1000.0000 mL | Freq: Once | INTRAVENOUS | Status: AC
  Administered 2013-04-16: 1000 mL via INTRAVENOUS

## 2013-04-16 MED ORDER — POTASSIUM CHLORIDE 10 MEQ/50ML IV SOLN
10.0000 meq | INTRAVENOUS | Status: AC
  Administered 2013-04-16 (×4): 10 meq via INTRAVENOUS
  Filled 2013-04-16 (×4): qty 50

## 2013-04-16 NOTE — Procedures (Signed)
[facility] PCCM Bedside Cardioversion Note  Indication: New onset A-fib with RVR and in setting of shock  Wife updated about situation and discussed risks and benefits.  The patient was medicated with 4mg  versed IV in addition to the fentanyl drip  Pads placed in font-back position; synchronized cardioversion attempt x3 with 150J x2 and 200J x1  The patient would return to a slower, atrial rhythm briefly returned but he would go back into a-fib with rvr within seconds every time.  Amiodarone ordered through pharmacy.  Casey Davila PCCM Pager: [phone] Cell: [phone] If no response, call [phone]

## 2013-04-16 NOTE — Procedures (Signed)
Central Venous Catheter Insertion Procedure Note Casey Davila [id number] July 26, 1925  Procedure: Insertion of Central Venous Catheter Indications: Assessment of intravascular volume and Drug and/or fluid administration  Procedure Details Consent: Risks of procedure as well as the alternatives and risks of each were explained to the (patient/caregiver).  Consent for procedure obtained. Time Out: Verified patient identification, verified procedure, site/side was marked, verified correct patient position, special equipment/implants available, medications/allergies/relevent history reviewed, required imaging and test results available.  Performed  Maximum sterile technique was used including antiseptics, cap, gloves, gown, hand hygiene, mask and sheet. Skin prep: Chlorhexidine; local anesthetic administered A antimicrobial bonded/coated triple lumen catheter was placed in the left internal jugular vein using the Seldinger technique.  Ultrasound was used to verify the patency of the vein and for real time needle guidance.  Evaluation Blood flow good Complications: No apparent complications Patient did tolerate procedure well. Chest X-ray ordered to verify placement.  CXR: pending.  Casey Davila 04/16/2013, 8:22 AM

## 2013-04-16 NOTE — Progress Notes (Signed)
eLink Physician-Brief Progress Note Patient Name: Casey Davila DOB: 1925-05-29 MRN: [id number]  Date of Service  04/16/2013   HPI/Events of Note  Patient s/p surgery for hernia repair and issues with aspiration prior to intubation.  Issues with hypotension s/p surgery with BP currently 79/39 (50) after approx 2 liters of IVFs.  HR of 81.  Has UOP.  eICU Interventions  Plan: Hold sedation for now 500 cc fluid bolus Insert aline for HD monitoring and confirm BP    Intervention Category Intermediate Interventions: Hypotension - evaluation and management  Rayhaan Huster 04/16/2013, 1:11 AM

## 2013-04-16 NOTE — Progress Notes (Signed)
PULMONARY  / CRITICAL CARE MEDICINE  Name: ALEN MATHESON MRN: [id number] DOB: 21-Apr-1926    ADMISSION DATE:  04/20/2013 CONSULTATION DATE:  04/03/2013  REFERRING MD :  Romie Levee, MD PRIMARY SERVICE: Surgery  CHIEF COMPLAINT:   Post repair of incarcerated femoral hernia. Out of the OR intubated.   BRIEF PATIENT DESCRIPTION:  79 years old male with PMH relevant for HTN.  Underwent emergent repair and now out of the OR intubated in the ICU. CCM consult called to assist with ventilator management.   SIGNIFICANT EVENTS / STUDIES:  12/14 CT angio chest/ab/pelv> esophagus full of fluid? (thick), nearly occludes left mainstem with external compression; left lower lung pulm nodule; aspiration pneumonitis bilateral base, SBO, R femoral hernia  LINES / TUBES: 12/14 L IJ CVL >> 12/13 ETT >>  CULTURES: 12/14 resp >> 12/14 blood >>  ANTIBIOTICS: 12/13 vanc >> 12/13 zosyn >>  SUBJECTIVE: awake and alert on vent, hypotension overnight  VITAL SIGNS: Temp:  [97.8 F (36.6 C)-98.8 F (37.1 C)] 98.5 F (36.9 C) (12/14 0400) Pulse Rate:  [32-120] 75 (12/14 0630) Resp:  [0-40] 20 (12/14 0630) BP: (75-159)/(37-85) 128/45 mmHg (12/14 0414) SpO2:  [88 %-100 %] 97 % (12/14 0630) FiO2 (%):  [60 %-100 %] 60 % (12/14 0414) Weight:  [70 kg (154 lb 5.2 oz)-71 kg (156 lb 8.4 oz)] 71 kg (156 lb 8.4 oz) (12/14 0400) HEMODYNAMICS:   VENTILATOR SETTINGS: Vent Mode:  [-] PRVC FiO2 (%):  [60 %-100 %] 60 % Set Rate:  [14 bmp-18 bmp] 18 bmp Vt Set:  [600 mL] 600 mL PEEP:  [5 cmH20] 5 cmH20 Plateau Pressure:  [15 cmH20-17 cmH20] 15 cmH20 INTAKE / OUTPUT: Intake/Output     12/13 0701 - 12/14 0700 12/14 0701 - 12/15 0700   I.V. (mL/kg) 5715.6 (80.5)    IV Piggyback 250    Total Intake(mL/kg) 5965.6 (84)    Urine (mL/kg/hr) 750    Emesis/NG output 850    Other 2250    Blood 75    Total Output 3925     Net +2040.6            PHYSICAL EXAMINATION: General: comfortable on vent HEENT: NCAT,  ETT in place PULM: Rhonchi bilaterally CV: RRR, no mgr AB: BS infrequent, non-tender Ext: warm, no edema, SCD's Neuro: arouses easily, follows commands  LABS:  CBC  Recent Labs Lab 04/13/13 0025 04/03/2013 1235 04/16/13 0413  WBC 4.7 15.9* 3.4*  HGB 11.6* 14.2 10.3*  HCT 34.3* 41.2 30.0*  PLT 196 224 136*   Coag's No results found for this basename: APTT, INR,  in the last 168 hours BMET  Recent Labs Lab 04/16/2013 1235 04/26/2013 2310 04/16/13 0413  NA 130* 132* 134*  K 3.5 3.8 3.4*  CL 87* 94* 101  CO2 21 24 21   BUN 44* 48* 51*  CREATININE 1.41* 1.50* 1.43*  GLUCOSE 195* 91 109*   Electrolytes  Recent Labs Lab 04/26/2013 1235 04/12/2013 2310 04/16/13 0413  CALCIUM 9.9 8.4 7.5*   Sepsis Markers  Recent Labs Lab 04/13/13 0044 04/17/2013 1235 04/06/2013 2300  LATICACIDVEN 1.36 7.0* 5.0*   ABG  Recent Labs Lab 05/01/2013 2020  PHART 7.375  PCO2ART 42.3  PO2ART 91.7   Liver Enzymes  Recent Labs Lab 04/03/2013 1235  AST 28  ALT 19  ALKPHOS 68  BILITOT 1.3*  ALBUMIN 3.7   Cardiac Enzymes  Recent Labs Lab 05/01/2013 1235  TROPONINI <0.30  PROBNP 448.0   Glucose No results  found for this basename: GLUCAP,  in the last 168 hours  Imaging    CXR: 12/13 X ray post OR showed worsening bilateral infiltrates R > L likely secondary to aspiration.  EKG: NSR, LAD, IVCD  ASSESSMENT / PLAN:  PULMONARY A: 1) Acute hypoxemic respiratory failure due to aspiration pneumonitis; high O2 requirements  P:   -Full vent support -wean FiO2 -ABG now -ABG/CXR/WUA/SBT 12/15 AM  CARDIOVASCULAR A:  1) Lactic acidosis > presumably from sepsis from pneumonia given normal bowel on laparoscopy P:  -place CVL -measure CVP -levophed for MAP > 65 -Tele  RENAL A:   1) Acute renal failure, likely pre renal from sepsis> improving P:   - Will follow BMP - continue IVF 100c/hr  GASTROINTESTINAL A:   1) Incarcerated right femoral hernia? Status post surgical  repair. Viable bowel. P:   - GI prophylaxis with protonix - Rest of care per general surgery  HEMATOLOGIC A:   1) No issues P:  - Will follow CBC  INFECTIOUS A:   1) Aspiration pneumonia P:   - Zosyn - Vancomycin > stop 12/15 if cultures negative and stable - blood/resp cultures sent 12/14  ENDOCRINE A:   1) No issues    NEUROLOGIC A:   1) Intubated, sedated P:   - Sedation and pain management with fentanyl - RASS score goal of -1   I have personally obtained a history, examined the patient, evaluated laboratory and imaging results, formulated the assessment and plan and placed orders. CRITICAL CARE: Critical Care Time devoted to patient care services described in this note is 45 minutes.   Yolonda Kida PCCM Pager: [phone] Cell: [phone] If no response, call [phone]   04/16/2013, 7:47 AM

## 2013-04-16 NOTE — Progress Notes (Signed)
[facility] ADULT ICU REPLACEMENT PROTOCOL FOR AM LAB REPLACEMENT ONLY  The patient does apply for the [facility] Adult ICU Electrolyte Replacment Protocol based on the criteria listed below:   1. Is GFR >/= 40 ml/min? yes  Patient's GFR today is 50 2. Is urine output >/= 0.5 ml/kg/hr for the last 6 hours? yes Patient's UOP is 1 ml/kg/hr 3. Is BUN < 60 mg/dL? yes  Patient's BUN today is 19 4. Abnormal electrolyte(s):K3.4 5. Ordered repletion with: Kcl / tube 6. If a panic level lab has been reported, has the CCM MD in charge been notified? yes.   Physician:  Holland Commons MD  Melrose Nakayama 04/16/2013 5:33 AM

## 2013-04-16 NOTE — Progress Notes (Signed)
Pt increased HR from 130-150, irregular rate, A fib.  Dr. Kendrick Fries notified, EKG obtained, showed Dr. Kendrick Fries.  Levophed d/c'd.  NS 1000cc infusing.  At 1030 Neosyn drip hung and started at 30 mcg/min.  Increased Neosyn drip to 60 mcg for BP.  Wife at bedside and updated.  Pt continues to have A fib with RVR, Dr. Kendrick Fries in and patient given 4 mg IV Versed and cardioverted x three tries.  Pt slowed briefly after each cardioversion, but remains in A fib and rate increases quickly after cardioversion done. Neosyn drip increased to 150 mcg due to low BP.  Amniodarone 150 mg IV bolus given, and drip started per order.  HR remains in A fib, with rate in 120's, and BP low.  500 cc bolus of NS given per order.  Continue to monitor closely.   Mckinley Olheiser Debroah Loop RN

## 2013-04-16 NOTE — Progress Notes (Signed)
1 Day Post-Op  Subjective: Awake on vent.  Objective: Vital signs in last 24 hours: Temp:  [97.8 F (36.6 C)-98.8 F (37.1 C)] 98.5 F (36.9 C) (12/14 0400) Pulse Rate:  [32-120] 75 (12/14 0630) Resp:  [0-40] 20 (12/14 0630) BP: (75-159)/(37-85) 128/45 mmHg (12/14 0414) SpO2:  [88 %-100 %] 97 % (12/14 0630) FiO2 (%):  [60 %-100 %] 60 % (12/14 0414) Weight:  [154 lb 5.2 oz (70 kg)-156 lb 8.4 oz (71 kg)] 156 lb 8.4 oz (71 kg) (12/14 0400)    Intake/Output from previous day: 12/13 0701 - 12/14 0700 In: 5965.6 [I.V.:5715.6; IV Piggyback:250] Out: 3925 [Urine:750; Emesis/NG output:850; Blood:75] Intake/Output this shift:    PE: General- In NAD Abdomen-soft, nontender GU-right groin dressing with some dried bloody drainage  Lab Results:   Recent Labs  05/03/2013 1235 04/16/13 0413  WBC 15.9* 3.4*  HGB 14.2 10.3*  HCT 41.2 30.0*  PLT 224 136*   BMET  Recent Labs  04/29/2013 2310 04/16/13 0413  NA 132* 134*  K 3.8 3.4*  CL 94* 101  CO2 24 21  GLUCOSE 91 109*  BUN 48* 51*  CREATININE 1.50* 1.43*  CALCIUM 8.4 7.5*   PT/INR No results found for this basename: LABPROT, INR,  in the last 72 hours Comprehensive Metabolic Panel:    Component Value Date/Time   NA 134* 04/16/2013 0413   K 3.4* 04/16/2013 0413   CL 101 04/16/2013 0413   CO2 21 04/16/2013 0413   BUN 51* 04/16/2013 0413   CREATININE 1.43* 04/16/2013 0413   GLUCOSE 109* 04/16/2013 0413   CALCIUM 7.5* 04/16/2013 0413   AST 28 04/12/2013 1235   ALT 19 04/21/2013 1235   ALKPHOS 68 04/23/2013 1235   BILITOT 1.3* 04/16/2013 1235   PROT 7.2 04/25/2013 1235   ALBUMIN 3.7 04/22/2013 1235     Studies/Results: Ct Angio Abdomen W/cm &/or Wo Contrast  04/21/2013   CLINICAL DATA:  Abdominal pain/distension, ectatic thoracic aorta  EXAM: CT ANGIOGRAPHY CHEST, ABDOMEN AND PELVIS  TECHNIQUE: Multidetector CT imaging through the chest, abdomen and pelvis was performed using the standard protocol during bolus  administration of intravenous contrast. Multiplanar reconstructed images including MIPs were obtained and reviewed to evaluate the vascular anatomy.  CONTRAST:  OMNIPAQUE IOHEXOL 350 MG/ML SOLN  COMPARISON:  None.  FINDINGS: CTA CHEST FINDINGS  On unenhanced imaging, there is no evidence of intramural hematoma. No evidence of thoracic aortic aneurysm or dissection. Although not tailored for evaluation of the pulmonary arteries, there is no evidence of pulmonary embolism.  Mild patchy opacities in the posterior right lower lobe (series 11/ image 20) and bilateral lower lobes (series 11/ image 40), suspicious for multifocal pneumonia, likely on the basis of aspiration.  Additional 6 mm subpleural nodule in the left lower lobe (series 11/ image 34). No pleural effusion or pneumothorax.  Visualized thyroid is mildly heterogeneous/nodular.  The heart is normal in size. No pericardial effusion. Atherosclerotic calcifications of the aortic arch.  Dilated, fluid-filled esophagus.  No suspicious mediastinal, hilar, or axillary lymphadenopathy.  Mild degenerative changes of the thoracic spine.  Review of the MIP images confirms the above findings.  CTA ABDOMEN AND PELVIS FINDINGS  No evidence of abdominal aortic aneurysm or dissection.  Distended, fluid-filled stomach.  Liver, pancreas, and bilateral adrenal glands are within normal limits.  Lobular contour of the spleen with peripheral calcifications (series 5/image 83), grossly unchanged, possibly reflecting sequela of prior trauma.  Gallbladder is unremarkable. No intrahepatic or extrahepatic ductal  dilatation.  9 mm lateral interpolar left renal cyst (series 5/image 107). Right kidney is unremarkable. No hydronephrosis.  Multiple dilated loops of proximal small bowel. Associated debris within distal small bowel in the right lower quadrant (series 5/image 177), leading to a small bowel containing right femoral hernia (series 5/images 180 and 189). Abrupt transition  as distal small bowel exits the hernia (series 5/image 178). Colon is largely decompressed. Colonic diverticulosis, without associated inflammatory changes.  Atherosclerotic calcifications of the abdominal aorta and branch vessels.  No evidence of abdominopelvic ascites.  No suspicious abdominopelvic lymphadenopathy.  Prostatomegaly, with enlargement of the central gland which indents the base of the bladder, likely reflecting benign prostatic hyperplasia.  Bladder is within normal limits.  Small fat containing left inguinal hernia (series 5/ image 191).  Degenerative changes of the lumbar spine.  Review of the MIP images confirms the above findings.  IMPRESSION: No evidence of thoracoabdominal aortic aneurysm or dissection. No evidence of pulmonary embolism.  Small bowel obstruction on the basis of a right femoral hernia. Associated distended stomach with dilated, fluid-filled esophagus.  Multifocal patchy pulmonary opacities, suspicious for aspiration pneumonia.  6 mm subpleural left lower lobe pulmonary nodule. Assuming high risk for primary bronchogenic neoplasm, initial follow-up CT chest is suggested in 6-12 months.  Numerous additional ancillary findings as above.  This recommendation follows the consensus statement: Guidelines for Management of Small Pulmonary Nodules Detected on CT Scans: A Statement from the Fleischner Society as published in Radiology 2005; 237:395-400.  These results were called by telephone at the time of interpretation on 04/18/2013 at 2:26 PM to Dr. Toy Cookey, who verbally acknowledged these results.   Electronically Signed   By: Charline Bills M.D.   On: 04/24/2013 14:40   Dg Chest Port 1 View  04/03/2013   CLINICAL DATA:  Status post intubation  EXAM: PORTABLE CHEST - 1 VIEW  COMPARISON:  CTA chest dated 04/22/2013 at 1406 hours. Chest radiograph dated 05/01/2013 at 1236 hours.  FINDINGS: Endotracheal tube terminates 5.5 cm above the carina.  Multifocal patchy opacities  in the right upper, right lower, and left lower lobes, progressed from recent chest radiograph, suspicious for aspiration pneumonia. No pleural effusion or pneumothorax.  The heart is normal in size.  Enteric tube coursing into the stomach.  IMPRESSION: Endotracheal tube terminates 5.5 cm above the carina.  Suspected multifocal pneumonia, progressed from recent chest radiograph, likely secondary to aspiration.   Electronically Signed   By: Charline Bills M.D.   On: 04/29/2013 20:25   Dg Chest Portable 1 View  04/09/2013   CLINICAL DATA:  Hypoxia, abdominal pain  EXAM: PORTABLE CHEST - 1 VIEW  COMPARISON:  None.  FINDINGS: Low volume exam with bibasilar opacities/atelectasis, worse in the left lower lobe. Difficult to completely exclude left base pneumonia. No edema or effusion. No pneumothorax. Ectatic thoracic aorta. Normal heart size and vascularity.  IMPRESSION: Bibasilar mild opacities and atelectasis, worse in the left lower lobe. Left base pneumonia not excluded.  Low lung volumes.   Electronically Signed   By: Ruel Favors M.D.   On: 04/26/2013 12:44   Ct Angio Chest Aortic Dissect W &/or W/o  04/22/2013   CLINICAL DATA:  Abdominal pain/distension, ectatic thoracic aorta  EXAM: CT ANGIOGRAPHY CHEST, ABDOMEN AND PELVIS  TECHNIQUE: Multidetector CT imaging through the chest, abdomen and pelvis was performed using the standard protocol during bolus administration of intravenous contrast. Multiplanar reconstructed images including MIPs were obtained and reviewed to evaluate the vascular anatomy.  CONTRAST:  OMNIPAQUE IOHEXOL 350 MG/ML SOLN  COMPARISON:  None.  FINDINGS: CTA CHEST FINDINGS  On unenhanced imaging, there is no evidence of intramural hematoma. No evidence of thoracic aortic aneurysm or dissection. Although not tailored for evaluation of the pulmonary arteries, there is no evidence of pulmonary embolism.  Mild patchy opacities in the posterior right lower lobe (series 11/ image 20)  and bilateral lower lobes (series 11/ image 40), suspicious for multifocal pneumonia, likely on the basis of aspiration.  Additional 6 mm subpleural nodule in the left lower lobe (series 11/ image 34). No pleural effusion or pneumothorax.  Visualized thyroid is mildly heterogeneous/nodular.  The heart is normal in size. No pericardial effusion. Atherosclerotic calcifications of the aortic arch.  Dilated, fluid-filled esophagus.  No suspicious mediastinal, hilar, or axillary lymphadenopathy.  Mild degenerative changes of the thoracic spine.  Review of the MIP images confirms the above findings.  CTA ABDOMEN AND PELVIS FINDINGS  No evidence of abdominal aortic aneurysm or dissection.  Distended, fluid-filled stomach.  Liver, pancreas, and bilateral adrenal glands are within normal limits.  Lobular contour of the spleen with peripheral calcifications (series 5/image 83), grossly unchanged, possibly reflecting sequela of prior trauma.  Gallbladder is unremarkable. No intrahepatic or extrahepatic ductal dilatation.  9 mm lateral interpolar left renal cyst (series 5/image 107). Right kidney is unremarkable. No hydronephrosis.  Multiple dilated loops of proximal small bowel. Associated debris within distal small bowel in the right lower quadrant (series 5/image 177), leading to a small bowel containing right femoral hernia (series 5/images 180 and 189). Abrupt transition as distal small bowel exits the hernia (series 5/image 178). Colon is largely decompressed. Colonic diverticulosis, without associated inflammatory changes.  Atherosclerotic calcifications of the abdominal aorta and branch vessels.  No evidence of abdominopelvic ascites.  No suspicious abdominopelvic lymphadenopathy.  Prostatomegaly, with enlargement of the central gland which indents the base of the bladder, likely reflecting benign prostatic hyperplasia.  Bladder is within normal limits.  Small fat containing left inguinal hernia (series 5/ image 191).   Degenerative changes of the lumbar spine.  Review of the MIP images confirms the above findings.  IMPRESSION: No evidence of thoracoabdominal aortic aneurysm or dissection. No evidence of pulmonary embolism.  Small bowel obstruction on the basis of a right femoral hernia. Associated distended stomach with dilated, fluid-filled esophagus.  Multifocal patchy pulmonary opacities, suspicious for aspiration pneumonia.  6 mm subpleural left lower lobe pulmonary nodule. Assuming high risk for primary bronchogenic neoplasm, initial follow-up CT chest is suggested in 6-12 months.  Numerous additional ancillary findings as above.  This recommendation follows the consensus statement: Guidelines for Management of Small Pulmonary Nodules Detected on CT Scans: A Statement from the Fleischner Society as published in Radiology 2005; 237:395-400.  These results were called by telephone at the time of interpretation on 04/12/2013 at 2:26 PM to Dr. Toy Cookey, who verbally acknowledged these results.   Electronically Signed   By: Charline Bills M.D.   On: 04/05/2013 14:40    Anti-infectives: Anti-infectives   Start     Dose/Rate Route Frequency Ordered Stop   04/16/13 0600  vancomycin (VANCOCIN) IVPB 750 mg/150 ml premix     750 mg 150 mL/hr over 60 Minutes Intravenous Every 12 hours 04/14/2013 1400     04/05/2013 2200  piperacillin-tazobactam (ZOSYN) IVPB 3.375 g     3.375 g 12.5 mL/hr over 240 Minutes Intravenous 3 times per day 04/03/2013 1400     04/04/2013 1530  vancomycin (VANCOCIN)  1,406 mg in sodium chloride 0.9 % 500 mL IVPB  Status:  Discontinued     20 mg/kg  70.3 kg 250 mL/hr over 120 Minutes Intravenous  Once 04/16/2013 1525 04/29/2013 1525   04/20/2013 1530  piperacillin-tazobactam (ZOSYN) IVPB 3.375 g  Status:  Discontinued     3.375 g 100 mL/hr over 30 Minutes Intravenous  Once 04/27/2013 1525 04/21/2013 1525   04/18/2013 1430  [MAR Hold]  vancomycin (VANCOCIN) IVPB 1000 mg/200 mL premix     (On MAR Hold since  04/26/2013 1630)   1,000 mg 200 mL/hr over 60 Minutes Intravenous STAT 04/28/2013 1358 04/13/2013 1632   04/03/2013 1430  piperacillin-tazobactam (ZOSYN) IVPB 3.375 g     3.375 g 100 mL/hr over 30 Minutes Intravenous STAT 05/01/2013 1358 04/10/2013 1514      Assessment    Incarcerated femoral hernia s/p emergency repair 04/26/2013   Acute respiratory failure-on vent    LOS: 1 day   Plan: Continue supportive care.   Tanecia Mccay J 04/16/2013

## 2013-04-16 NOTE — Procedures (Signed)
Arterial Catheter Insertion Procedure Note YUMA PACELLA [id number] [birth year]  Procedure: Insertion of Arterial Catheter  Indications: Blood pressure monitoring  Procedure Details Consent: Unable to obtain consent because of altered level of consciousness. Time Out: Verified patient identification, verified procedure, site/side was marked, verified correct patient position, special equipment/implants available, medications/allergies/relevent history reviewed, required imaging and test results available.  Performed  Maximum sterile technique was used including antiseptics, cap, gloves, gown, hand hygiene, mask and sheet. Skin prep: Chlorhexidine; local anesthetic administered 20 gauge catheter was inserted into right radial artery using the Seldinger technique.  Evaluation Blood flow good; BP tracing good. Complications: No apparent complications.   Berton Bon 04/16/2013

## 2013-04-17 ENCOUNTER — Encounter (HOSPITAL_COMMUNITY): Payer: Self-pay | Admitting: General Surgery

## 2013-04-17 ENCOUNTER — Inpatient Hospital Stay (HOSPITAL_COMMUNITY): Payer: Medicare Other

## 2013-04-17 LAB — BASIC METABOLIC PANEL
BUN: 46 mg/dL — ABNORMAL HIGH (ref 6–23)
Creatinine, Ser: 1.13 mg/dL (ref 0.50–1.35)
GFR calc Af Amer: 66 mL/min — ABNORMAL LOW (ref >90)
GFR calc non Af Amer: 57 mL/min — ABNORMAL LOW (ref >90)
Glucose, Bld: 94 mg/dL (ref 70–99)
Potassium: 3.9 mEq/L (ref 3.5–5.1)

## 2013-04-17 LAB — BLOOD GAS, ARTERIAL
Acid-base deficit: 4.6 mmol/L — ABNORMAL HIGH (ref 0.0–2.0)
Bicarbonate: 18.9 mEq/L — ABNORMAL LOW (ref 20.0–24.0)
Bicarbonate: 20.1 mEq/L (ref 20.0–24.0)
Drawn by: 308601
FIO2: 0.4 %
O2 Saturation: 81.8 %
PEEP: 10 cmH2O
PEEP: 8 cmH2O
Patient temperature: 98.6
TCO2: 17.7 mmol/L (ref 0–100)
TCO2: 18.8 mmol/L (ref 0–100)
pCO2 arterial: 34.5 mmHg — ABNORMAL LOW (ref 35.0–45.0)
pH, Arterial: 7.359 (ref 7.350–7.450)
pO2, Arterial: 50.9 mmHg — ABNORMAL LOW (ref 80.0–100.0)

## 2013-04-17 LAB — CBC WITH DIFFERENTIAL/PLATELET
Basophils Absolute: 0 10*3/uL (ref 0.0–0.1)
Eosinophils Relative: 0 % (ref 0–5)
Lymphocytes Relative: 5 % — ABNORMAL LOW (ref 12–46)
Lymphs Abs: 0.7 10*3/uL (ref 0.7–4.0)
MCH: 27.7 pg (ref 26.0–34.0)
MCV: 81.3 fL (ref 78.0–100.0)
Monocytes Absolute: 0.1 10*3/uL (ref 0.1–1.0)
Monocytes Relative: 1 % — ABNORMAL LOW (ref 3–12)
Platelets: 123 10*3/uL — ABNORMAL LOW (ref 150–400)
RBC: 3.58 MIL/uL — ABNORMAL LOW (ref 4.22–5.81)
RDW: 16.8 % — ABNORMAL HIGH (ref 11.5–15.5)
WBC Morphology: INCREASED
WBC: 14.4 10*3/uL — ABNORMAL HIGH (ref 4.0–10.5)

## 2013-04-17 MED ORDER — AMIODARONE HCL IN DEXTROSE 360-4.14 MG/200ML-% IV SOLN
30.0000 mg/h | INTRAVENOUS | Status: DC
  Administered 2013-04-17 – 2013-04-18 (×3): 30 mg/h via INTRAVENOUS
  Filled 2013-04-17 (×2): qty 200

## 2013-04-17 NOTE — Progress Notes (Signed)
Patient ID: Casey Davila, male   DOB: 23-Jun-1925, 79 y.o.   MRN: [id number] 2 Days Post-Op  Subjective: Pt awake on vent.  He denies any abdominal pain.  CCM feels that sepsis and elevated lactic acid on arrival were likely related to asp PNA and not bowel.  Patient denies flatus  Objective: Vital signs in last 24 hours: Temp:  [97.8 F (36.6 C)-101.2 F (38.4 C)] 97.8 F (36.6 C) (12/15 0400) Pulse Rate:  [65-152] 65 (12/15 0811) Resp:  [18-26] 22 (12/15 0811) BP: (105-130)/(59-69) 127/64 mmHg (12/15 0811) SpO2:  [93 %-100 %] 95 % (12/15 0811) FiO2 (%):  [40 %-60 %] 40 % (12/15 0811) Weight:  [175 lb 0.7 oz (79.4 kg)] 175 lb 0.7 oz (79.4 kg) (12/15 0400)    Intake/Output from previous day: 12/14 0701 - 12/15 0700 In: 7237.1 [I.V.:5257.1; NG/GT:30; IV Piggyback:1950] Out: 1685 [Urine:1260; Emesis/NG output:425] Intake/Output this shift: Total I/O In: -  Out: 150 [Urine:150]  PE: Abd: soft, very hypoactive BS, NGT with some brown bilious fluid overnight, cannister just changed.  Incision c/d/i with staples Heart: regular  Lab Results:   Recent Labs  04/16/13 0413 04/17/13 0315  WBC 3.4* 14.4*  HGB 10.3* 9.9*  HCT 30.0* 29.1*  PLT 136* 123*   BMET  Recent Labs  04/16/13 0413 04/17/13 0315  NA 134* 134*  K 3.4* 3.9  CL 101 103  CO2 21 20  GLUCOSE 109* 94  BUN 51* 46*  CREATININE 1.43* 1.13  CALCIUM 7.5* 7.1*   PT/INR No results found for this basename: LABPROT, INR,  in the last 72 hours CMP     Component Value Date/Time   NA 134* 04/17/2013 0315   K 3.9 04/17/2013 0315   CL 103 04/17/2013 0315   CO2 20 04/17/2013 0315   GLUCOSE 94 04/17/2013 0315   BUN 46* 04/17/2013 0315   CREATININE 1.13 04/17/2013 0315   CALCIUM 7.1* 04/17/2013 0315   PROT 7.2 04/29/2013 1235   ALBUMIN 3.7 04/20/2013 1235   AST 28 04/27/2013 1235   ALT 19 04/29/2013 1235   ALKPHOS 68 04/14/2013 1235   BILITOT 1.3* 04/16/2013 1235   GFRNONAA 57* 04/17/2013 0315   GFRAA 66*  04/17/2013 0315   Lipase  No results found for this basename: lipase       Studies/Results: Ct Angio Abdomen W/cm &/or Wo Contrast  04/16/2013   CLINICAL DATA:  Abdominal pain/distension, ectatic thoracic aorta  EXAM: CT ANGIOGRAPHY CHEST, ABDOMEN AND PELVIS  TECHNIQUE: Multidetector CT imaging through the chest, abdomen and pelvis was performed using the standard protocol during bolus administration of intravenous contrast. Multiplanar reconstructed images including MIPs were obtained and reviewed to evaluate the vascular anatomy.  CONTRAST:  OMNIPAQUE IOHEXOL 350 MG/ML SOLN  COMPARISON:  None.  FINDINGS: CTA CHEST FINDINGS  On unenhanced imaging, there is no evidence of intramural hematoma. No evidence of thoracic aortic aneurysm or dissection. Although not tailored for evaluation of the pulmonary arteries, there is no evidence of pulmonary embolism.  Mild patchy opacities in the posterior right lower lobe (series 11/ image 20) and bilateral lower lobes (series 11/ image 40), suspicious for multifocal pneumonia, likely on the basis of aspiration.  Additional 6 mm subpleural nodule in the left lower lobe (series 11/ image 34). No pleural effusion or pneumothorax.  Visualized thyroid is mildly heterogeneous/nodular.  The heart is normal in size. No pericardial effusion. Atherosclerotic calcifications of the aortic arch.  Dilated, fluid-filled esophagus.  No suspicious mediastinal,  hilar, or axillary lymphadenopathy.  Mild degenerative changes of the thoracic spine.  Review of the MIP images confirms the above findings.  CTA ABDOMEN AND PELVIS FINDINGS  No evidence of abdominal aortic aneurysm or dissection.  Distended, fluid-filled stomach.  Liver, pancreas, and bilateral adrenal glands are within normal limits.  Lobular contour of the spleen with peripheral calcifications (series 5/image 83), grossly unchanged, possibly reflecting sequela of prior trauma.  Gallbladder is unremarkable. No  intrahepatic or extrahepatic ductal dilatation.  9 mm lateral interpolar left renal cyst (series 5/image 107). Right kidney is unremarkable. No hydronephrosis.  Multiple dilated loops of proximal small bowel. Associated debris within distal small bowel in the right lower quadrant (series 5/image 177), leading to a small bowel containing right femoral hernia (series 5/images 180 and 189). Abrupt transition as distal small bowel exits the hernia (series 5/image 178). Colon is largely decompressed. Colonic diverticulosis, without associated inflammatory changes.  Atherosclerotic calcifications of the abdominal aorta and branch vessels.  No evidence of abdominopelvic ascites.  No suspicious abdominopelvic lymphadenopathy.  Prostatomegaly, with enlargement of the central gland which indents the base of the bladder, likely reflecting benign prostatic hyperplasia.  Bladder is within normal limits.  Small fat containing left inguinal hernia (series 5/ image 191).  Degenerative changes of the lumbar spine.  Review of the MIP images confirms the above findings.  IMPRESSION: No evidence of thoracoabdominal aortic aneurysm or dissection. No evidence of pulmonary embolism.  Small bowel obstruction on the basis of a right femoral hernia. Associated distended stomach with dilated, fluid-filled esophagus.  Multifocal patchy pulmonary opacities, suspicious for aspiration pneumonia.  6 mm subpleural left lower lobe pulmonary nodule. Assuming high risk for primary bronchogenic neoplasm, initial follow-up CT chest is suggested in 6-12 months.  Numerous additional ancillary findings as above.  This recommendation follows the consensus statement: Guidelines for Management of Small Pulmonary Nodules Detected on CT Scans: A Statement from the Fleischner Society as published in Radiology 2005; 237:395-400.  These results were called by telephone at the time of interpretation on 04/18/2013 at 2:26 PM to Dr. Toy Cookey, who verbally  acknowledged these results.   Electronically Signed   By: Charline Bills M.D.   On: 04/09/2013 14:40   Dg Chest Port 1 View  04/17/2013   CLINICAL DATA:  Endotracheal tube position.  EXAM: PORTABLE CHEST - 1 VIEW  COMPARISON:  04/16/2013 and CT chest 05/01/2013.  FINDINGS: Endotracheal tube is in satisfactory position. Nasogastric tube is followed into the stomach with the tip projecting beyond the inferior margin of the image. Left IJ central line tip projects in the SVC. Defibrillator pads are in place over the left chest.  Heart size stable. Biapical pleural thickening, right greater left. Slight coarsening and diffuse patchy bilateral airspace disease. Bilateral pleural effusions.  IMPRESSION: 1. Slight worsening in bilateral airspace disease, possibly due to pneumonia. Edema is also considered. 2. Bilateral pleural effusions.   Electronically Signed   By: Leanna Battles M.D.   On: 04/17/2013 07:06   Dg Chest Port 1 View  04/16/2013   CLINICAL DATA:  Status post central line placement  EXAM: PORTABLE CHEST - 1 VIEW  COMPARISON:  04/11/2013  FINDINGS: A new left jugular central line is noted with the catheter tip in the proximal superior vena cava. No pneumothorax is noted. Diffuse bilateral infiltrates are noted right greater than left stable from the prior exam. A nasogastric catheter remains in the stomach. The endotracheal tube is again seen and stable at 7.5  cm above the carinal. The cardiac shadow is within normal limits.  IMPRESSION: Status post central line placement on the left. No pneumothorax is noted. The catheter tip is noted in the proximal superior vena cava. Remainder the chest stable from the previous day.  These results were called by telephone at the time of interpretation on 04/16/2013 at 8:52 AM to Amy, the patient's nurse who verbally acknowledged these results.   Electronically Signed   By: Alcide Clever M.D.   On: 04/16/2013 08:52   Dg Chest Port 1 View  04/04/2013    CLINICAL DATA:  Status post intubation  EXAM: PORTABLE CHEST - 1 VIEW  COMPARISON:  CTA chest dated 04/08/2013 at 1406 hours. Chest radiograph dated 04/20/2013 at 1236 hours.  FINDINGS: Endotracheal tube terminates 5.5 cm above the carina.  Multifocal patchy opacities in the right upper, right lower, and left lower lobes, progressed from recent chest radiograph, suspicious for aspiration pneumonia. No pleural effusion or pneumothorax.  The heart is normal in size.  Enteric tube coursing into the stomach.  IMPRESSION: Endotracheal tube terminates 5.5 cm above the carina.  Suspected multifocal pneumonia, progressed from recent chest radiograph, likely secondary to aspiration.   Electronically Signed   By: Charline Bills M.D.   On: 04/28/2013 20:25   Dg Chest Portable 1 View  04/13/2013   CLINICAL DATA:  Hypoxia, abdominal pain  EXAM: PORTABLE CHEST - 1 VIEW  COMPARISON:  None.  FINDINGS: Low volume exam with bibasilar opacities/atelectasis, worse in the left lower lobe. Difficult to completely exclude left base pneumonia. No edema or effusion. No pneumothorax. Ectatic thoracic aorta. Normal heart size and vascularity.  IMPRESSION: Bibasilar mild opacities and atelectasis, worse in the left lower lobe. Left base pneumonia not excluded.  Low lung volumes.   Electronically Signed   By: Ruel Favors M.D.   On: 04/04/2013 12:44   Ct Angio Chest Aortic Dissect W &/or W/o  04/29/2013   CLINICAL DATA:  Abdominal pain/distension, ectatic thoracic aorta  EXAM: CT ANGIOGRAPHY CHEST, ABDOMEN AND PELVIS  TECHNIQUE: Multidetector CT imaging through the chest, abdomen and pelvis was performed using the standard protocol during bolus administration of intravenous contrast. Multiplanar reconstructed images including MIPs were obtained and reviewed to evaluate the vascular anatomy.  CONTRAST:  OMNIPAQUE IOHEXOL 350 MG/ML SOLN  COMPARISON:  None.  FINDINGS: CTA CHEST FINDINGS  On unenhanced imaging, there is no  evidence of intramural hematoma. No evidence of thoracic aortic aneurysm or dissection. Although not tailored for evaluation of the pulmonary arteries, there is no evidence of pulmonary embolism.  Mild patchy opacities in the posterior right lower lobe (series 11/ image 20) and bilateral lower lobes (series 11/ image 40), suspicious for multifocal pneumonia, likely on the basis of aspiration.  Additional 6 mm subpleural nodule in the left lower lobe (series 11/ image 34). No pleural effusion or pneumothorax.  Visualized thyroid is mildly heterogeneous/nodular.  The heart is normal in size. No pericardial effusion. Atherosclerotic calcifications of the aortic arch.  Dilated, fluid-filled esophagus.  No suspicious mediastinal, hilar, or axillary lymphadenopathy.  Mild degenerative changes of the thoracic spine.  Review of the MIP images confirms the above findings.  CTA ABDOMEN AND PELVIS FINDINGS  No evidence of abdominal aortic aneurysm or dissection.  Distended, fluid-filled stomach.  Liver, pancreas, and bilateral adrenal glands are within normal limits.  Lobular contour of the spleen with peripheral calcifications (series 5/image 83), grossly unchanged, possibly reflecting sequela of prior trauma.  Gallbladder is unremarkable.  No intrahepatic or extrahepatic ductal dilatation.  9 mm lateral interpolar left renal cyst (series 5/image 107). Right kidney is unremarkable. No hydronephrosis.  Multiple dilated loops of proximal small bowel. Associated debris within distal small bowel in the right lower quadrant (series 5/image 177), leading to a small bowel containing right femoral hernia (series 5/images 180 and 189). Abrupt transition as distal small bowel exits the hernia (series 5/image 178). Colon is largely decompressed. Colonic diverticulosis, without associated inflammatory changes.  Atherosclerotic calcifications of the abdominal aorta and branch vessels.  No evidence of abdominopelvic ascites.  No suspicious  abdominopelvic lymphadenopathy.  Prostatomegaly, with enlargement of the central gland which indents the base of the bladder, likely reflecting benign prostatic hyperplasia.  Bladder is within normal limits.  Small fat containing left inguinal hernia (series 5/ image 191).  Degenerative changes of the lumbar spine.  Review of the MIP images confirms the above findings.  IMPRESSION: No evidence of thoracoabdominal aortic aneurysm or dissection. No evidence of pulmonary embolism.  Small bowel obstruction on the basis of a right femoral hernia. Associated distended stomach with dilated, fluid-filled esophagus.  Multifocal patchy pulmonary opacities, suspicious for aspiration pneumonia.  6 mm subpleural left lower lobe pulmonary nodule. Assuming high risk for primary bronchogenic neoplasm, initial follow-up CT chest is suggested in 6-12 months.  Numerous additional ancillary findings as above.  This recommendation follows the consensus statement: Guidelines for Management of Small Pulmonary Nodules Detected on CT Scans: A Statement from the Fleischner Society as published in Radiology 2005; 237:395-400.  These results were called by telephone at the time of interpretation on 04/25/2013 at 2:26 PM to Dr. Toy Cookey, who verbally acknowledged these results.   Electronically Signed   By: Charline Bills M.D.   On: 04/21/2013 14:40    Anti-infectives: Anti-infectives   Start     Dose/Rate Route Frequency Ordered Stop   04/16/13 0600  vancomycin (VANCOCIN) IVPB 750 mg/150 ml premix     750 mg 150 mL/hr over 60 Minutes Intravenous Every 12 hours 04/23/2013 1400     04/23/2013 2200  piperacillin-tazobactam (ZOSYN) IVPB 3.375 g     3.375 g 12.5 mL/hr over 240 Minutes Intravenous 3 times per day 04/16/2013 1400     04/28/2013 1530  vancomycin (VANCOCIN) 1,406 mg in sodium chloride 0.9 % 500 mL IVPB  Status:  Discontinued     20 mg/kg  70.3 kg 250 mL/hr over 120 Minutes Intravenous  Once 04/29/2013 1525 04/19/2013 1525    04/29/2013 1530  piperacillin-tazobactam (ZOSYN) IVPB 3.375 g  Status:  Discontinued     3.375 g 100 mL/hr over 30 Minutes Intravenous  Once 04/11/2013 1525 04/04/2013 1525   04/13/2013 1430  [MAR Hold]  vancomycin (VANCOCIN) IVPB 1000 mg/200 mL premix     (On MAR Hold since 04/10/2013 1630)   1,000 mg 200 mL/hr over 60 Minutes Intravenous STAT 04/14/2013 1358 04/17/2013 1632   04/21/2013 1430  piperacillin-tazobactam (ZOSYN) IVPB 3.375 g     3.375 g 100 mL/hr over 30 Minutes Intravenous STAT 04/06/2013 1358 05/01/2013 1514       Assessment/Plan  1. POD2, s/p open repair of right femoral hernia - A. Thomas 2. SBO, resolved with #1 3. Post op ileus 4. A fibw with RVR, converted on amio 5. VDRF secondary to asp PNA  6. Leukocytosis  Plan: 1. Appreciate CCM assistance with this patient. 2. Cont NGT.  Will d/w Dr. Ezzard Standing, but don't suspect we'll start TFs until at least tomorrow. 3. Wound looks  good.  Daily dry dressing changes. 4. Cont abx therapy 5. Recheck labs in am  LOS: 2 days    OSBORNE,KELLY E 04/17/2013, 8:44 AM Pager: [phone]  Agree with above. Patient remains intubated.  Very alert and comfortable. Wife in room. Would hold any feedings at least until tomorrow.  Ovidio Kin, MD, [facility] Pager: [phone] Office phone:  [phone]

## 2013-04-17 NOTE — Progress Notes (Signed)
INITIAL NUTRITION ASSESSMENT  DOCUMENTATION CODES Per approved criteria  -Not Applicable   INTERVENTION: - Once pt ready for TF, recommend starting trickle TF of Vital AF 1.2 at 18ml/hr. If tolerated, recommend Vital AF 1.2 at 51ml/hr increase by 10ml every 4 hours to goal of 15ml/hr. Goal rate of TF will provide 1872 calories, 117g protein, and free water and will meet 98% estimated calorie needs and 100% estimated protein needs. If IVF d/c, recommend water flushes 4 times/day. - Will continue to monitor   NUTRITION DIAGNOSIS: Inadequate oral intake related to inability to eat as evidenced by NPO.   Goal: Initiation of TF with goal to meet >90% of estimated nutritional needs  Monitor:  Weights, labs, TF initiation/advancement, vent status  Reason for Assessment: Ventilated pt, malnutrition screening tool   79 y.o. male  Admitting Dx: Incarcerated femoral hernia  ASSESSMENT: Admitted with abdominal pain and constipation x 3 days with nausea. Found to have small bowel obstruction with incarcerated femoral hernia which was surgically repaired 04/17/2013. During intubation for surgery he was found to have gastric content in his vocal cords. Had cardioversion yesterday for new onset A-fib with RVR in the setting of shock. Surgery following, noted plans for possible TF tomorrow. Pt with NGT in place with recent canister change, had some brown bilious fluid overnight with documented total output yesterday. Noted pt's weight up 20 pounds since admission and pt with +1 lower extremity edema.   Patient is currently intubated on ventilator support.  MV: 11.1 L/min Temp (24hrs), Avg:99.4 F (37.4 C), Min:97.8 F (36.6 C), Max:101.2 F (38.4 C)  Propofol: off   Height: Ht Readings from Last 1 Encounters:  04/28/2013 5\' 11"  (1.803 m)    Weight: Wt Readings from Last 1 Encounters:  04/17/13 175 lb 0.7 oz (79.4 kg)  Admit wt:        155 lb   Net I/Os: +7.8L  Ideal Body  Weight: 172 lb  % Ideal Body Weight: 90%  Wt Readings from Last 10 Encounters:  04/17/13 175 lb 0.7 oz (79.4 kg)  04/17/13 175 lb 0.7 oz (79.4 kg)  04/04/13 160 lb (72.576 kg)  01/05/08 152 lb 4 oz (69.06 kg)    Usual Body Weight: 160 lb earlier this month  % Usual Body Weight: 97%  BMI:  Body mass index is 24.42 kg/(m^2).  Estimated Nutritional Needs: Kcal: 1904 Protein: 95-120g Fluid: >1.9L/day  Skin: +1 RLE, LLE edema, abdominal incision   Diet Order: NPO  EDUCATION NEEDS: -No education needs identified at this time   Intake/Output Summary (Last 24 hours) at 04/17/13 0924 Last data filed at 04/17/13 0811  Gross per 24 hour  Intake 5983.32 ml  Output   1735 ml  Net 4248.32 ml    Last BM: PTA  Labs:   Recent Labs Lab 04/16/2013 2310 04/16/13 0413 04/17/13 0315  NA 132* 134* 134*  K 3.8 3.4* 3.9  CL 94* 101 103  CO2 24 21 20   BUN 48* 51* 46*  CREATININE 1.50* 1.43* 1.13  CALCIUM 8.4 7.5* 7.1*  GLUCOSE 91 109* 94    CBG (last 3)  No results found for this basename: GLUCAP,  in the last 72 hours  Scheduled Meds: . acetaminophen  1,000 mg Intravenous Q6H  . antiseptic oral rinse  1 application Mouth Rinse QID  . chlorhexidine  15 mL Mouth/Throat BID  . heparin  5,000 Units Subcutaneous Q8H  . pantoprazole (PROTONIX) IV  40 mg Intravenous Q24H  .  piperacillin-tazobactam (ZOSYN)  IV  3.375 g Intravenous Q8H    Continuous Infusions: . sodium chloride 1,000 mL (04/17/13 0401)  . fentaNYL infusion INTRAVENOUS 15 mcg/hr (04/17/13 0811)  . phenylephrine (NEO-SYNEPHRINE) Adult infusion 40 mcg/min (04/17/13 0909)  . propofol Stopped (04/16/13 0245)    Past Medical History  Diagnosis Date  . Hypertension     Past Surgical History  Procedure Laterality Date  . Inguinal hernia repair Right 25 years ago  . Eye surgery    . Spine surgery  15 years ago    Levon Hedger MS, [place], [place] [phone] Pager [phone] After Hours Pager

## 2013-04-17 NOTE — Progress Notes (Signed)
Dr. Kendrick Fries called to update on pts status. O2 sats were dropping to high 80s on the vent at 8 of PEEP, 40% fio2, and 450 tidal volume. ABG pO2 had decreased since this morning. MD said to increase PEEP back to 10 and monitor.

## 2013-04-17 NOTE — Progress Notes (Signed)
CARE MANAGEMENT NOTE 04/17/2013  Patient:  Casey Davila, Casey Davila   Account Number:  [id number]  Date Initiated:  04/17/2013  Documentation initiated by:  Emer Onnen  Subjective/Objective Assessment:   pt with surgical intervention for a incarcerated femorial hernia, unable to extubated post or     Action/Plan:   lives in the alf at the [place] home with home pt and an rn   Anticipated DC Date:  04/20/2013   Anticipated DC Plan:  ASSISTED LIVING / REST HOME  In-house referral  Clinical Social Worker      DC Associate Professor  CM consult      [facility] Choice  HOME HEALTH   Choice offered to / List presented to:  NA   DME arranged  NA      DME agency  NA     HH arranged  NA      HH agency  NA   Status of service:  In process, will continue to follow Medicare Important Message given?  NA - LOS <3 / Initial given by admissions (If response is "NO", the following Medicare IM given date fields will be blank) Date Medicare IM given:   Date Additional Medicare IM given:    Discharge Disposition:    Per UR Regulation:  Reviewed for med. necessity/level of care/duration of stay  If discussed at Long Length of Stay Meetings, dates discussed:    Comments:  [id number]/Eiley Mcginnity Stark Jock, BSN, [place] [phone] Chart Reviewed for discharge and hospital needs. Discharge needs at time of review:  None present will follow for needs. From assisted living at the [facility] home but may need str or snf. Review of patient progress due on [id number].

## 2013-04-17 NOTE — Progress Notes (Signed)
PULMONARY  / CRITICAL CARE MEDICINE  Name: Casey Davila MRN: [id number] DOB: November 30, 1925    ADMISSION DATE:  April 23, 2013 CONSULTATION DATE:  2013-04-23  REFERRING MD :  Romie Levee, MD PRIMARY SERVICE: Surgery  CHIEF COMPLAINT:   Post repair of incarcerated femoral hernia. Out of the OR intubated.   BRIEF PATIENT DESCRIPTION:  79 years old male with PMH relevant for HTN.  Underwent emergent repair and now out of the OR intubated in the ICU. CCM consult called to assist with ventilator management.   SIGNIFICANT EVENTS / STUDIES:  12/14 CT angio chest/ab/pelv> esophagus full of fluid? (thick), nearly occludes left mainstem with external compression; left lower lung pulm nodule; aspiration pneumonitis bilateral base, SBO, R femoral hernia 12/14 afib with rvr/shock, attempted cardioversion x3 unsuccessful  LINES / TUBES: 12/14 L IJ CVL >> 12/13 ETT >>  CULTURES: 12/14 resp >> 12/14 blood >>  ANTIBIOTICS: 12/13 vanc >> 12/13 zosyn >>12/15  SUBJECTIVE:  Comfortable on vent, awake and alert, pressors weaning, FiO2 weaning, fever  VITAL SIGNS: Temp:  [97.8 F (36.6 C)-101.2 F (38.4 C)] 98.3 F (36.8 C) (12/15 0811) Pulse Rate:  [65-152] 65 (12/15 0811) Resp:  [18-26] 22 (12/15 0811) BP: (105-130)/(59-69) 127/64 mmHg (12/15 0811) SpO2:  [93 %-100 %] 95 % (12/15 0811) FiO2 (%):  [40 %-60 %] 40 % (12/15 0811) Weight:  [79.4 kg (175 lb 0.7 oz)] 79.4 kg (175 lb 0.7 oz) (12/15 0400) HEMODYNAMICS: CVP:  [4 mmHg-11 mmHg] 10 mmHg VENTILATOR SETTINGS: Vent Mode:  [-] PRVC FiO2 (%):  [40 %-60 %] 40 % Set Rate:  [18 bmp] 18 bmp Vt Set:  [600 mL] 600 mL PEEP:  [10 cmH20] 10 cmH20 Plateau Pressure:  [23 cmH20-26 cmH20] 26 cmH20 INTAKE / OUTPUT: Intake/Output     12/14 0701 - 12/15 0700 12/15 0701 - 12/16 0700   I.V. (mL/kg) 5257.1 (66.2)    NG/GT 30    IV Piggyback 1950    Total Intake(mL/kg) 7237.1 (91.1)    Urine (mL/kg/hr) 1260 (0.7) 150 (1)   Emesis/NG output 425 (0.2)     Other     Blood     Total Output 1685 150   Net +5552.1 -150          PHYSICAL EXAMINATION: General: comfortable on vent HEENT: NCAT, ETT in place PULM: Rhonchi bilaterally CV: RRR, no mgr AB: BS noted, non-tender Ext: warm, no edema, SCD's Neuro: arouses easily, follows commands  LABS:  CBC  Recent Labs Lab April 23, 2013 1235 04/16/13 0413 04/17/13 0315  WBC 15.9* 3.4* 14.4*  HGB 14.2 10.3* 9.9*  HCT 41.2 30.0* 29.1*  PLT 224 136* 123*   Coag's No results found for this basename: APTT, INR,  in the last 168 hours BMET  Recent Labs Lab Apr 23, 2013 2310 04/16/13 0413 04/17/13 0315  NA 132* 134* 134*  K 3.8 3.4* 3.9  CL 94* 101 103  CO2 24 21 20   BUN 48* 51* 46*  CREATININE 1.50* 1.43* 1.13  GLUCOSE 91 109* 94   Electrolytes  Recent Labs Lab Apr 23, 2013 2310 04/16/13 0413 04/17/13 0315  CALCIUM 8.4 7.5* 7.1*   Sepsis Markers  Recent Labs Lab 04/13/13 0044 23-Apr-2013 1235 Apr 23, 2013 2300  LATICACIDVEN 1.36 7.0* 5.0*   ABG  Recent Labs Lab 2013-04-23 2020 04/16/13 0914 04/17/13 0418  PHART 7.375 7.392 7.359  PCO2ART 42.3 35.6 34.5*  PO2ART 91.7 57.2* 69.5*   Liver Enzymes  Recent Labs Lab 04/23/2013 1235  AST 28  ALT 19  ALKPHOS  68  BILITOT 1.3*  ALBUMIN 3.7   Cardiac Enzymes  Recent Labs Lab 04/16/13 1235  TROPONINI <0.30  PROBNP 448.0   Glucose No results found for this basename: GLUCAP,  in the last 168 hours  Imaging    CXR: 12/13 X ray post OR showed worsening bilateral infiltrates R > L likely secondary to aspiration.  EKG: NSR, LAD, IVCD  ASSESSMENT / PLAN:  PULMONARY A: 1) Acute hypoxemic respiratory failure due to aspiration pneumonitis; O2 requirements about the same today (12/15)  P:   -Full vent support -adjust TVol to 6cc/kg -decrease PEEP -target PaO2 55-65 -ABG/CXR/WUA/SBT 12/16 AM -diurese when able  CARDIOVASCULAR A:  1) Septic shock > improving 2) New onset afib with RVR due to sepsis and  levophed, failed cardioversion; converted with amiodarone P:  - Neosynephrine for MAP > 65 - Amiodarone through 12/16 then hopefully d/c -Tele -follow CVP  RENAL A:   1) Acute renal failure> resolved P:   - Will follow BMP - continue IVF 100c/hr  GASTROINTESTINAL A:   1) R femoral hernia and SBO, viable bowel; Status post surgical repair P:   - GI prophylaxis with protonix - Rest of care per general surgery - Tube feeding 12/16 likely  HEMATOLOGIC A:   1) mild thrombocytopenia P:  - Will follow CBC, monitor for bleeding  INFECTIOUS A:   1) Aspiration pneumonia P:   - Zosyn - Vancomycin > stop - f/u blood/resp cultures sent 12/14  ENDOCRINE A:   1) No issues    NEUROLOGIC A:   1) Intubated, sedated P:   - Sedation and pain management with fentanyl - RASS score goal of -1   I have personally obtained a history, examined the patient, evaluated laboratory and imaging results, formulated the assessment and plan and placed orders. CRITICAL CARE: Critical Care Time devoted to patient care services described in this note is 40 minutes.   Yolonda Kida PCCM Pager: [phone] Cell: [phone] If no response, call [phone]   04/17/2013, 8:53 AM

## 2013-04-18 ENCOUNTER — Inpatient Hospital Stay (HOSPITAL_COMMUNITY): Payer: Medicare Other

## 2013-04-18 DIAGNOSIS — K56609 Unspecified intestinal obstruction, unspecified as to partial versus complete obstruction: Secondary | ICD-10-CM

## 2013-04-18 DIAGNOSIS — J9589 Other postprocedural complications and disorders of respiratory system, not elsewhere classified: Secondary | ICD-10-CM

## 2013-04-18 DIAGNOSIS — D696 Thrombocytopenia, unspecified: Secondary | ICD-10-CM

## 2013-04-18 DIAGNOSIS — J690 Pneumonitis due to inhalation of food and vomit: Secondary | ICD-10-CM

## 2013-04-18 DIAGNOSIS — I4891 Unspecified atrial fibrillation: Secondary | ICD-10-CM

## 2013-04-18 DIAGNOSIS — J9600 Acute respiratory failure, unspecified whether with hypoxia or hypercapnia: Secondary | ICD-10-CM

## 2013-04-18 LAB — BLOOD GAS, ARTERIAL
Bicarbonate: 20.7 mEq/L (ref 20.0–24.0)
Drawn by: 11249
O2 Saturation: 89.1 %
PEEP: 10 cmH2O
Patient temperature: 98.6
RATE: 18 resp/min
pH, Arterial: 7.329 — ABNORMAL LOW (ref 7.350–7.450)
pO2, Arterial: 61.3 mmHg — ABNORMAL LOW (ref 80.0–100.0)

## 2013-04-18 LAB — BASIC METABOLIC PANEL
BUN: 39 mg/dL — ABNORMAL HIGH (ref 6–23)
CO2: 21 mEq/L (ref 19–32)
CO2: 23 mEq/L (ref 19–32)
Calcium: 8.2 mg/dL — ABNORMAL LOW (ref 8.4–10.5)
Calcium: 8.4 mg/dL (ref 8.4–10.5)
Chloride: 106 mEq/L (ref 96–112)
Chloride: 108 mEq/L (ref 96–112)
Creatinine, Ser: 0.94 mg/dL (ref 0.50–1.35)
GFR calc Af Amer: 89 mL/min — ABNORMAL LOW (ref >90)
GFR calc Af Amer: 86 mL/min — ABNORMAL LOW (ref >90)
GFR calc non Af Amer: 77 mL/min — ABNORMAL LOW (ref >90)
Glucose, Bld: 97 mg/dL (ref 70–99)
Glucose, Bld: 86 mg/dL (ref 70–99)
Potassium: 3.9 mEq/L (ref 3.5–5.1)
Sodium: 137 mEq/L (ref 135–145)
Sodium: 140 mEq/L (ref 135–145)

## 2013-04-18 LAB — CBC
HCT: 34.5 % — ABNORMAL LOW (ref 39.0–52.0)
Hemoglobin: 11.5 g/dL — ABNORMAL LOW (ref 13.0–17.0)
MCH: 27.2 pg (ref 26.0–34.0)
MCHC: 33.3 g/dL (ref 30.0–36.0)
MCV: 81.6 fL (ref 78.0–100.0)
RBC: 4.23 MIL/uL (ref 4.22–5.81)
WBC: 16.3 10*3/uL — ABNORMAL HIGH (ref 4.0–10.5)

## 2013-04-18 LAB — MAGNESIUM: Magnesium: 2.2 mg/dL (ref 1.5–2.5)

## 2013-04-18 MED ORDER — ENOXAPARIN SODIUM 40 MG/0.4ML ~~LOC~~ SOLN
40.0000 mg | Freq: Every day | SUBCUTANEOUS | Status: DC
  Administered 2013-04-18 – 2013-04-24 (×7): 40 mg via SUBCUTANEOUS
  Filled 2013-04-18 (×7): qty 0.4

## 2013-04-18 MED ORDER — METOPROLOL TARTRATE 1 MG/ML IV SOLN
2.5000 mg | INTRAVENOUS | Status: DC | PRN
  Administered 2013-04-18 (×2): 2.5 mg via INTRAVENOUS
  Administered 2013-04-19 – 2013-04-20 (×2): 5 mg via INTRAVENOUS
  Administered 2013-04-25: 3 mg via INTRAVENOUS
  Filled 2013-04-18 (×6): qty 5

## 2013-04-18 MED ORDER — FUROSEMIDE 10 MG/ML IJ SOLN
INTRAMUSCULAR | Status: AC
  Filled 2013-04-18: qty 4

## 2013-04-18 MED ORDER — FUROSEMIDE 10 MG/ML IJ SOLN
40.0000 mg | Freq: Two times a day (BID) | INTRAMUSCULAR | Status: AC
  Administered 2013-04-18 (×2): 40 mg via INTRAVENOUS
  Filled 2013-04-18 (×2): qty 4

## 2013-04-18 MED ORDER — POTASSIUM CHLORIDE 10 MEQ/50ML IV SOLN
INTRAVENOUS | Status: AC
  Filled 2013-04-18: qty 50

## 2013-04-18 MED ORDER — POTASSIUM CHLORIDE 10 MEQ/50ML IV SOLN
10.0000 meq | INTRAVENOUS | Status: AC
  Administered 2013-04-18 (×4): 10 meq via INTRAVENOUS
  Filled 2013-04-18 (×3): qty 50

## 2013-04-18 MED ORDER — AMIODARONE LOAD VIA INFUSION
150.0000 mg | Freq: Once | INTRAVENOUS | Status: AC
  Administered 2013-04-18: 150 mg via INTRAVENOUS
  Filled 2013-04-18: qty 83.34

## 2013-04-18 NOTE — Progress Notes (Signed)
Patient ID: Casey Davila, male   DOB: 10/02/1925, 79 y.o.   MRN: [id number] 3 Days Post-Op  Subjective: Pt intubated.  Having increased pink frothy exudate from ET tubing.  Patient denies flatus  Objective: Vital signs in last 24 hours: Temp:  [97.8 F (36.6 C)-98.8 F (37.1 C)] 97.8 F (36.6 C) (12/16 0400) Pulse Rate:  [59-143] 81 (12/16 0700) Resp:  [20-35] 26 (12/16 0700) BP: (100-148)/(45-65) 123/45 mmHg (12/15 2231) SpO2:  [90 %-99 %] 95 % (12/16 0700) FiO2 (%):  [40 %-60 %] 60 % (12/16 0738) Weight:  [177 lb 11.1 oz (80.6 kg)] 177 lb 11.1 oz (80.6 kg) (12/16 0500)    Intake/Output from previous day: 12/15 0701 - 12/16 0700 In: 3721.6 [I.V.:2921.6; IV Piggyback:150] Out: 935 [Urine:810; Emesis/NG output:125] Intake/Output this shift:    PE: Abd: soft, very hypoactive BS still, NGT with minimal output, ND, right inguinal incision c/d/i with staples  Lab Results:   Recent Labs  04/17/13 0315 04/18/13 0545  WBC 14.4* 16.3*  HGB 9.9* 11.5*  HCT 29.1* 34.5*  PLT 123* 98*   BMET  Recent Labs  04/17/13 0315 04/18/13 0545  NA 134* 137  K 3.9 3.6  CL 103 106  CO2 20 21  GLUCOSE 94 97  BUN 46* 39*  CREATININE 1.13 0.87  CALCIUM 7.1* 8.2*   PT/INR No results found for this basename: LABPROT, INR,  in the last 72 hours CMP     Component Value Date/Time   NA 137 04/18/2013 0545   K 3.6 04/18/2013 0545   CL 106 04/18/2013 0545   CO2 21 04/18/2013 0545   GLUCOSE 97 04/18/2013 0545   BUN 39* 04/18/2013 0545   CREATININE 0.87 04/18/2013 0545   CALCIUM 8.2* 04/18/2013 0545   PROT 7.2 2013-04-21 1235   ALBUMIN 3.7 April 21, 2013 1235   AST 28 04-21-2013 1235   ALT 19 04-21-2013 1235   ALKPHOS 68 04/21/2013 1235   BILITOT 1.3* 04/21/13 1235   GFRNONAA 77* 04/18/2013 0545   GFRAA 89* 04/18/2013 0545   Lipase  No results found for this basename: lipase       Studies/Results: Dg Chest Port 1 View  04/17/2013   CLINICAL DATA:  Endotracheal tube  position.  EXAM: PORTABLE CHEST - 1 VIEW  COMPARISON:  04/16/2013 and CT chest 2013/04/21.  FINDINGS: Endotracheal tube is in satisfactory position. Nasogastric tube is followed into the stomach with the tip projecting beyond the inferior margin of the image. Left IJ central line tip projects in the SVC. Defibrillator pads are in place over the left chest.  Heart size stable. Biapical pleural thickening, right greater left. Slight coarsening and diffuse patchy bilateral airspace disease. Bilateral pleural effusions.  IMPRESSION: 1. Slight worsening in bilateral airspace disease, possibly due to pneumonia. Edema is also considered. 2. Bilateral pleural effusions.   Electronically Signed   By: Leanna Battles M.D.   On: 04/17/2013 07:06   Dg Chest Port 1 View  04/16/2013   CLINICAL DATA:  Status post central line placement  EXAM: PORTABLE CHEST - 1 VIEW  COMPARISON:  21-Apr-2013  FINDINGS: A new left jugular central line is noted with the catheter tip in the proximal superior vena cava. No pneumothorax is noted. Diffuse bilateral infiltrates are noted right greater than left stable from the prior exam. A nasogastric catheter remains in the stomach. The endotracheal tube is again seen and stable at 7.5 cm above the carinal. The cardiac shadow is within normal limits.  IMPRESSION:  Status post central line placement on the left. No pneumothorax is noted. The catheter tip is noted in the proximal superior vena cava. Remainder the chest stable from the previous day.  These results were called by telephone at the time of interpretation on 04/16/2013 at 8:52 AM to Amy, the patient's nurse who verbally acknowledged these results.   Electronically Signed   By: Alcide Clever M.D.   On: 04/16/2013 08:52    Anti-infectives: Anti-infectives   Start     Dose/Rate Route Frequency Ordered Stop   04/16/13 0600  vancomycin (VANCOCIN) IVPB 750 mg/150 ml premix  Status:  Discontinued     750 mg 150 mL/hr over 60 Minutes  Intravenous Every 12 hours 05/03/2013 1400 04/17/13 0858   04/16/2013 2200  piperacillin-tazobactam (ZOSYN) IVPB 3.375 g     3.375 g 12.5 mL/hr over 240 Minutes Intravenous 3 times per day 04/08/2013 1400     05/01/2013 1530  vancomycin (VANCOCIN) 1,406 mg in sodium chloride 0.9 % 500 mL IVPB  Status:  Discontinued     20 mg/kg  70.3 kg 250 mL/hr over 120 Minutes Intravenous  Once 04/13/2013 1525 05/01/2013 1525   04/03/2013 1530  piperacillin-tazobactam (ZOSYN) IVPB 3.375 g  Status:  Discontinued     3.375 g 100 mL/hr over 30 Minutes Intravenous  Once 04/18/2013 1525 04/17/2013 1525   04/26/2013 1430  [MAR Hold]  vancomycin (VANCOCIN) IVPB 1000 mg/200 mL premix     (On MAR Hold since 04/06/2013 1630)   1,000 mg 200 mL/hr over 60 Minutes Intravenous STAT 04/03/2013 1358 04/20/2013 1632   04/10/2013 1430  piperacillin-tazobactam (ZOSYN) IVPB 3.375 g     3.375 g 100 mL/hr over 30 Minutes Intravenous STAT 04/05/2013 1358 04/19/2013 1514       Assessment/Plan  1. POD 3, s/p open repair of right femoral hernia - A. Thomas  2. SBO, resolved with #1  3. Post op ileus  4. A fibw with RVR, converted on amio  5. VDRF secondary to asp PNA  6. Leukocytosis  Plan: 1. Vent management per CCM.  Appreciative of all their assistance 2. Cont NGT for post op ileus.  Patient not ready to start TFs yet. 3. Wound conts to look good. 4. Cont abx therapy for pulmonary issues per ccm  LOS: 3 days    OSBORNE,KELLY E 04/18/2013, 8:05 AM Pager: [phone]  Intubated.  Wife in room. Still with ileus.  Continue NGT for now.  Ovidio Kin, MD, [facility] Surgery Pager: [phone] Office phone:  [phone]

## 2013-04-18 NOTE — Progress Notes (Signed)
PULMONARY  / CRITICAL CARE MEDICINE  Name: Casey Davila MRN: [id number] DOB: [birth year]    ADMISSION DATE:  April 29, 2013 CONSULTATION DATE:  29-Apr-2013  REFERRING MD :  Romie Levee, MD PRIMARY SERVICE: Surgery  CHIEF COMPLAINT:   Post repair of incarcerated femoral hernia. Out of the OR intubated.   BRIEF PATIENT DESCRIPTION:  79 years old male with PMH relevant for HTN.  Underwent emergent repair and now out of the OR intubated in the ICU. CCM consult called to assist with ventilator management.   SIGNIFICANT EVENTS / STUDIES:  12/14 CT angio chest/ab/pelv> esophagus full of fluid? (thick), nearly occludes left mainstem with external compression; left lower lung pulm nodule; aspiration pneumonitis bilateral base, SBO, R femoral hernia 12/14 afib with rvr/shock, attempted cardioversion x3 unsuccessful  LINES / TUBES: 12/14 L IJ CVL >> 12/13 ETT >>  CULTURES: 12/14 resp >> 12/14 blood >>  ANTIBIOTICS: 12/13 vanc >>12/15 12/13 zosyn >>  SUBJECTIVE:  Comfortable on vent, awake and alert, pressors weaning, FiO2 weaning, fever  VITAL SIGNS: Temp:  [97.8 F (36.6 C)-98.8 F (37.1 C)] 97.8 F (36.6 C) (12/16 0400) Pulse Rate:  [59-143] 81 (12/16 0700) Resp:  [20-35] 26 (12/16 0700) BP: (100-148)/(45-65) 123/45 mmHg (12/15 2231) SpO2:  [90 %-99 %] 95 % (12/16 0700) FiO2 (%):  [40 %-60 %] 60 % (12/16 0738) Weight:  [80.6 kg (177 lb 11.1 oz)] 80.6 kg (177 lb 11.1 oz) (12/16 0500) HEMODYNAMICS: CVP:  [8 mmHg-16 mmHg] 8 mmHg VENTILATOR SETTINGS: Vent Mode:  [-] PRVC FiO2 (%):  [40 %-60 %] 60 % Set Rate:  [18 bmp] 18 bmp Vt Set:  [450 mL] 450 mL PEEP:  [8 cmH20-10 cmH20] 10 cmH20 Plateau Pressure:  [15 cmH20-22 cmH20] 22 cmH20 INTAKE / OUTPUT: Intake/Output     12/15 0701 - 12/16 0700 12/16 0701 - 12/17 0700   I.V. (mL/kg) 2921.6 (36.2)    Other 650    NG/GT     IV Piggyback 150    Total Intake(mL/kg) 3721.6 (46.2)    Urine (mL/kg/hr) 810 (0.4)    Emesis/NG output  125 (0.1)    Total Output 935     Net +2786.6            PHYSICAL EXAMINATION: General: comfortable on vent HEENT: NCAT, ETT in place PULM: Rhonchi bilaterally, no accessory muscle use  CV: RRR, no mgr AB: BS noted, non-tender Ext: warm, no edema, SCD's Neuro: arouses easily, follows commands  LABS:  CBC  Recent Labs Lab 04/16/13 0413 04/17/13 0315 04/18/13 0545  WBC 3.4* 14.4* 16.3*  HGB 10.3* 9.9* 11.5*  HCT 30.0* 29.1* 34.5*  PLT 136* 123* 98*   Coag's No results found for this basename: APTT, INR,  in the last 168 hours BMET  Recent Labs Lab 04/16/13 0413 04/17/13 0315 04/18/13 0545  NA 134* 134* 137  K 3.4* 3.9 3.6  CL 101 103 106  CO2 21 20 21   BUN 51* 46* 39*  CREATININE 1.43* 1.13 0.87  GLUCOSE 109* 94 97   Electrolytes  Recent Labs Lab 04/16/13 0413 04/17/13 0315 04/18/13 0545  CALCIUM 7.5* 7.1* 8.2*   Sepsis Markers  Recent Labs Lab 04/13/13 0044 2013/04/29 1235 04-29-13 2300  LATICACIDVEN 1.36 7.0* 5.0*   ABG  Recent Labs Lab 04/17/13 0418 04/17/13 1057 04/18/13 0358  PHART 7.359 7.343* 7.329*  PCO2ART 34.5* 37.9 40.4  PO2ART 69.5* 50.9* 61.3*   Liver Enzymes  Recent Labs Lab 2013-04-29 1235  AST 28  ALT 19  ALKPHOS 68  BILITOT 1.3*  ALBUMIN 3.7   Cardiac Enzymes  Recent Labs Lab 05/03/2013 1235  TROPONINI <0.30  PROBNP 448.0   Glucose No results found for this basename: GLUCAP,  in the last 168 hours  Imaging  CXR: 12/16 CXR R>L airspace disease, however aeration has improved in comparison to prior film   EKG: NSR w/ 1st degree Hb and freq PACs  ASSESSMENT / PLAN:  PULMONARY A: 1) Acute hypoxemic respiratory failure due to aspiration pneumonitis; O2 requirements about the same today (12/16), but CXR a little better   P:   -Full vent support -adjust TVol to 6cc/kg -decrease PEEP -target PaO2 55-65 -ABG/CXR/WUA/SBT 12/17 AM -trial diuresis   CARDIOVASCULAR A:  1) Septic shock >  improving/resolved 2) New onset afib with RVR due to sepsis and levophed, failed cardioversion; converted with amiodarone, but now in 1st degree HB w/ freq PACs P:  D/c amio, PRN lopressor Empirically replace KCL F/u Mg KVO IVFs Lasix trial  Think we can avoid full dose anticoagulation for now, but if goes back into AF will need to consider  RENAL A:   1) Acute renal failure> resolved P:   - Will follow BMP - continue IVF 100c/hr  GASTROINTESTINAL A:   1) R femoral hernia and SBO, viable bowel; Status post surgical repair P:   - GI prophylaxis with protonix - Rest of care per general surgery - Tube feeding on hold per surgery. Really need to challenge gut soon. Needs nutritional support   HEMATOLOGIC A:   1) mild thrombocytopenia (dropped 50%) P:  - Will follow CBC, monitor for bleeding - hold heparin, switch to lovenox, check HIT panel  INFECTIOUS A:   1) Aspiration pneumonia P:   - Zosyn - f/u blood/resp cultures sent 12/14  ENDOCRINE A:   1) No issues    NEUROLOGIC A:   1) Intubated, sedated P:   - Sedation and pain management with fentanyl - RASS score goal of -1  Family: Wife updated at bedside  I have personally obtained a history, examined the patient, evaluated laboratory and imaging results, formulated the assessment and plan and placed orders. CRITICAL CARE: Critical Care Time devoted to patient care services described in this note is 45 minutes.   BABCOCK,PETE    04/18/2013, 8:32 AM   Attending:  I have seen and examined the patient with nurse practitioner/resident and agree with the note above.   Afib still a problem, go for rate control today, anticoagulate tomorrow if still in Fib ARDS > diurese today  Thrombocytopenia> switch to lovenox, check HITT Panel  Yolonda Kida PCCM Pager: [phone] Cell: [phone] If no response, call [phone]

## 2013-04-19 ENCOUNTER — Inpatient Hospital Stay (HOSPITAL_COMMUNITY): Payer: Medicare Other

## 2013-04-19 DIAGNOSIS — E43 Unspecified severe protein-calorie malnutrition: Secondary | ICD-10-CM | POA: Diagnosis not present

## 2013-04-19 DIAGNOSIS — J80 Acute respiratory distress syndrome: Secondary | ICD-10-CM | POA: Diagnosis not present

## 2013-04-19 DIAGNOSIS — I4891 Unspecified atrial fibrillation: Secondary | ICD-10-CM | POA: Diagnosis not present

## 2013-04-19 DIAGNOSIS — A419 Sepsis, unspecified organism: Secondary | ICD-10-CM

## 2013-04-19 DIAGNOSIS — D691 Qualitative platelet defects: Secondary | ICD-10-CM | POA: Diagnosis present

## 2013-04-19 DIAGNOSIS — E46 Unspecified protein-calorie malnutrition: Secondary | ICD-10-CM

## 2013-04-19 LAB — BLOOD GAS, ARTERIAL
Acid-Base Excess: 0.4 mmol/L (ref 0.0–2.0)
Bicarbonate: 25.4 mEq/L — ABNORMAL HIGH (ref 20.0–24.0)
Drawn by: 11249
FIO2: 0.6 %
MECHVT: 450 mL
O2 Saturation: 92.9 %
PEEP: 10 cmH2O
Patient temperature: 99.3
RATE: 18 resp/min
pH, Arterial: 7.36 (ref 7.350–7.450)

## 2013-04-19 LAB — CULTURE, RESPIRATORY W GRAM STAIN: Special Requests: NORMAL

## 2013-04-19 LAB — COMPREHENSIVE METABOLIC PANEL
ALT: 13 U/L (ref 0–53)
AST: 21 U/L (ref 0–37)
Albumin: 1.8 g/dL — ABNORMAL LOW (ref 3.5–5.2)
CO2: 26 mEq/L (ref 19–32)
Calcium: 8.6 mg/dL (ref 8.4–10.5)
Creatinine, Ser: 1.06 mg/dL (ref 0.50–1.35)
Sodium: 143 mEq/L (ref 135–145)
Total Protein: 5.3 g/dL — ABNORMAL LOW (ref 6.0–8.3)

## 2013-04-19 LAB — GLUCOSE, CAPILLARY
Glucose-Capillary: 107 mg/dL — ABNORMAL HIGH (ref 70–99)
Glucose-Capillary: 111 mg/dL — ABNORMAL HIGH (ref 70–99)

## 2013-04-19 LAB — CBC
HCT: 31.5 % — ABNORMAL LOW (ref 39.0–52.0)
HCT: 32.2 % — ABNORMAL LOW (ref 39.0–52.0)
MCH: 27 pg (ref 26.0–34.0)
MCHC: 33.5 g/dL (ref 30.0–36.0)
MCV: 81 fL (ref 78.0–100.0)
Platelets: 76 10*3/uL — ABNORMAL LOW (ref 150–400)
Platelets: 86 10*3/uL — ABNORMAL LOW (ref 150–400)
RBC: 3.89 MIL/uL — ABNORMAL LOW (ref 4.22–5.81)
RDW: 17.1 % — ABNORMAL HIGH (ref 11.5–15.5)
RDW: 17.1 % — ABNORMAL HIGH (ref 11.5–15.5)
WBC: 20.7 10*3/uL — ABNORMAL HIGH (ref 4.0–10.5)

## 2013-04-19 MED ORDER — VITAL AF 1.2 CAL PO LIQD
1000.0000 mL | ORAL | Status: DC
Start: 2013-04-19 — End: 2013-04-19
  Filled 2013-04-19: qty 1000

## 2013-04-19 MED ORDER — VITAL AF 1.2 CAL PO LIQD
1000.0000 mL | ORAL | Status: DC
  Administered 2013-04-19: 1000 mL
  Filled 2013-04-19 (×2): qty 1000

## 2013-04-19 MED ORDER — POTASSIUM CHLORIDE 10 MEQ/50ML IV SOLN
10.0000 meq | INTRAVENOUS | Status: AC
  Administered 2013-04-19 (×4): 10 meq via INTRAVENOUS
  Filled 2013-04-19 (×4): qty 50

## 2013-04-19 MED ORDER — PANTOPRAZOLE SODIUM 40 MG PO PACK
40.0000 mg | PACK | Freq: Every day | ORAL | Status: DC
  Administered 2013-04-19 – 2013-04-28 (×10): 40 mg
  Filled 2013-04-19 (×11): qty 20

## 2013-04-19 MED ORDER — POTASSIUM CHLORIDE 20 MEQ/15ML (10%) PO LIQD
40.0000 meq | Freq: Once | ORAL | Status: AC
  Administered 2013-04-19: 40 meq
  Filled 2013-04-19: qty 30

## 2013-04-19 MED ORDER — FUROSEMIDE 10 MG/ML IJ SOLN
40.0000 mg | Freq: Once | INTRAMUSCULAR | Status: AC
  Administered 2013-04-19: 40 mg via INTRAVENOUS
  Filled 2013-04-19: qty 4

## 2013-04-19 NOTE — Progress Notes (Signed)
PULMONARY  / CRITICAL CARE MEDICINE  Name: Casey Davila MRN: [id number] DOB: 08/30/1925    ADMISSION DATE:  04/29/2013 CONSULTATION DATE:  04/29/2013  REFERRING MD :  Romie Levee, MD PRIMARY SERVICE: Surgery  CHIEF COMPLAINT:   Post repair of incarcerated femoral hernia. Out of the OR intubated.   BRIEF PATIENT DESCRIPTION:  79 years old male with PMH relevant for HTN.  Underwent emergent repair and now out of the OR intubated in the ICU. CCM consult called to assist with ventilator management.   SIGNIFICANT EVENTS / STUDIES:  12/14 CT angio chest/ab/pelv> esophagus full of fluid? (thick), nearly occludes left mainstem with external compression; left lower lung pulm nodule; aspiration pneumonitis bilateral base, SBO, R femoral hernia 12/14 afib with rvr/shock, attempted cardioversion x3 unsuccessful  LINES / TUBES: 12/14 L IJ CVL >> 12/13 ETT >>  CULTURES: 12/14 resp: GNR>>> 12/14 blood >>  ANTIBIOTICS: 12/13 vanc >>12/15 12/13 zosyn >>  SUBJECTIVE:  Comfortable on vent, awake and alert, off pressors, diuresing   VITAL SIGNS: Temp:  [97.9 F (36.6 C)-99.8 F (37.7 C)] 99.5 F (37.5 C) (12/17 0351) Pulse Rate:  [28-94] 94 (12/17 0700) Resp:  [18-40] 18 (12/17 0700) SpO2:  [93 %-100 %] 100 % (12/17 0700) FiO2 (%):  [50 %-60 %] 50 % (12/17 0740) Weight:  [168 lb 6.9 oz (76.4 kg)] 168 lb 6.9 oz (76.4 kg) (12/17 0611) HEMODYNAMICS: CVP:  [2 mmHg-6 mmHg] 2 mmHg VENTILATOR SETTINGS: Vent Mode:  [-] PRVC FiO2 (%):  [50 %-60 %] 50 % Set Rate:  [18 bmp] 18 bmp Vt Set:  [450 mL] 450 mL PEEP:  [10 cmH20] 10 cmH20 Plateau Pressure:  [19 cmH20-22 cmH20] 21 cmH20 INTAKE / OUTPUT: Intake/Output     12/16 0701 - 12/17 0700 12/17 0701 - 12/18 0700   I.V. (mL/kg) 576.8 (7.5)    NG/GT 30    IV Piggyback 400    Total Intake(mL/kg) 1006.8 (13.2)    Urine (mL/kg/hr) 4840 (2.6)    Emesis/NG output 50 (0)    Total Output 4890     Net -3883.2            PHYSICAL  EXAMINATION: General: comfortable on vent HEENT: NCAT, ETT in place PULM: Rhonchi bilaterally, no accessory muscle use  CV: RRR, no mgr, NSR on tele  AB: BS noted, non-tender Ext: warm, no edema, SCD's Neuro: arouses easily, follows commands  LABS:  CBC  Recent Labs Lab 04/17/13 0315 04/18/13 0545 04/19/13 0427  WBC 14.4* 16.3* 18.2*  HGB 9.9* 11.5* 10.5*  HCT 29.1* 34.5* 31.5*  PLT 123* 98* 76*   Coag's No results found for this basename: APTT, INR,  in the last 168 hours BMET  Recent Labs Lab 04/18/13 0545 04/18/13 1448 04/19/13 0427  NA 137 140 143  K 3.6 3.9 3.2*  CL 106 108 108  CO2 21 23 26   BUN 39* 38* 41*  CREATININE 0.87 0.94 1.06  GLUCOSE 97 86 95   Electrolytes  Recent Labs Lab 04/18/13 0545 04/18/13 1448 04/19/13 0427  CALCIUM 8.2* 8.4 8.6  MG  --  2.2  --    Sepsis Markers  Recent Labs Lab 04/13/13 0044 04/10/2013 1235 04/27/2013 2300  LATICACIDVEN 1.36 7.0* 5.0*   ABG  Recent Labs Lab 04/17/13 1057 04/18/13 0358 04/19/13 0342  PHART 7.343* 7.329* 7.360  PCO2ART 37.9 40.4 46.4*  PO2ART 50.9* 61.3* 71.3*   Liver Enzymes  Recent Labs Lab 04/11/2013 1235 04/19/13 0427  AST 28 21  ALT 19 13  ALKPHOS 68 94  BILITOT 1.3* 1.2  ALBUMIN 3.7 1.8*   Cardiac Enzymes  Recent Labs Lab 04/27/2013 1235  TROPONINI <0.30  PROBNP 448.0   Glucose No results found for this basename: GLUCAP,  in the last 168 hours   Imaging  CXR: 12/17 CXR R>L airspace disease,no sig change when c/w 12/16  EKG: NSR w/ occ PAC  ASSESSMENT / PLAN:  PULMONARY A: 1) Acute hypoxemic respiratory failure due to aspiration pneumonitis/ ARDS P:   -Full vent support -Cont TVol @ 6cc/kg -decrease PEEP to 8  -target PaO2 55-65 -ABG/CXR/WUA/SBT 12/17 AM -cont diuresis ALA FACT approach   CARDIOVASCULAR A:  1) Septic shock > resolved 2) New onset afib with RVR due to sepsis and levophed, failed cardioversion; converted with amiodarone, Now NSR w/  occ PAC P:  PRN lopressor Empirically replace KCL F/u Mg KVO IVFs Think we can avoid full dose anticoagulation for now, but if goes back into AF will need to consider  RENAL A:   Acute renal failure> resolved >very minimal cr rise w/ > 3 liter net neg after lasix on 12/16. Hypokalemia  P:   Lasix as tolerated, will do 1 dose 12/17 aiming for 1.5 net neg. W/ goal to avoid further creatinine rise but cont fluid removal. May need to hold lasix 12/18 if creatinine cont to rise.  F/u creatinine in am  KVO IVF  Replace KCL  GASTROINTESTINAL A:   1) R femoral hernia and SBO, viable bowel; Status post surgical repair P:   - GI prophylaxis with protonix -to start trickle feeds 12/17  HEMATOLOGIC A:   mild thrombocytopenia (dropped 50%) Plts cont to fall even after change to LMWH.  Mild elevation in WBC  P:  - Will follow CBC, monitor for bleeding  - obtain another cbc this afternoon 12/17, if PLTs still falling may need to stop LMWH as well.  - f/u HIT panel, if neg consider alternative etiology: medication vs possible occult infection   INFECTIOUS A:   1) Aspiration pneumonia >>>GNR sputum  P:   - Zosyn - f/u blood/resp cultures sent 12/14  ENDOCRINE A:   1) No issues    NEUROLOGIC A:   1) Intubated, sedated P:   - Sedation and pain management with fentanyl - RASS score goal of -1     BABCOCK,PETE    Attending:  I have seen and examined the patient with nurse practitioner/resident and agree with the note above.   WBC continues to rise and PLTs continue to fall, but overall he is improving (better oxygenation, shock resolved, ileus slowly resolving) Check DIC panel, continue current therapies for now Change NG tube to OG tube due to rising WBC count  I have personally obtained a history, examined the patient, evaluated laboratory and imaging results, formulated the assessment and plan and placed orders. CRITICAL CARE: Critical Care Time devoted to  patient care services described in this note is 45 minutes.   Yolonda Kida PCCM Pager: [phone] Cell: [phone] If no response, call [phone]

## 2013-04-19 NOTE — Progress Notes (Addendum)
NUTRITION FOLLOW UP/CONSULT FOR TF MANAGEMENT  Intervention:   - Initiate trickle TF of Vital AF 1.2 at 29ml/hr via OGT - Initiate adult enteral protocol - Will continue to monitor   Nutrition Dx:   Inadequate oral intake related to inability to eat as evidenced by NPO - ongoing   Goal:   Initiation of TF with goal to meet >90% of estimated nutritional needs - TF initiation will be met today, plan is to advance TF per surgery  Monitor:   Weights, labs, vent status, TF initiation/tolerance/advancement, BM  Assessment:   Admitted with abdominal pain and constipation x 3 days with nausea. Found to have small bowel obstruction with incarcerated femoral hernia which was surgically repaired 04/04/2013. During intubation for surgery he was found to have gastric content in his vocal cords. Had cardioversion yesterday for new onset A-fib with RVR in the setting of shock.   12/15 - Surgery following, noted plans for possible TF tomorrow. Pt with NGT in place with recent canister change, had some brown bilious fluid overnight with documented total output yesterday. Noted pt's weight up 20 pounds since admission and pt with +1 lower extremity edema.   12/17 - Per surgery's notes, pt's ileus seems to be resolving, pt passing flatus with minimal NGT output. Plan is to start trickle TF but to be advanced only per surgery. Pt's weight up 13 pounds since admission.   Patient is currently intubated on ventilator support.  MV: 9.7 L/min Temp (24hrs), Avg:99 F (37.2 C), Min:97.9 F (36.6 C), Max:99.8 F (37.7 C)  Propofol: off  Potassium low, getting IV replacement.    Height: Ht Readings from Last 1 Encounters:  04/29/2013 5\' 11"  (1.803 m)    Weight Status:   Wt Readings from Last 1 Encounters:  04/19/13 168 lb 6.9 oz (76.4 kg)  Admit wt:        155 lb (70.3 kg)  Net I/Os: +5.3L  Re-estimated needs:  Kcal: 1744 Protein: 95-120g Fluid: >1.7L/day  Skin: +2 RLE, LLE edema, abdominal  incision   Diet Order: NPO   Intake/Output Summary (Last 24 hours) at 04/19/13 0929 Last data filed at 04/19/13 0910  Gross per 24 hour  Intake  837.9 ml  Output   5055 ml  Net -4217.1 ml    Last BM: PTA   Labs:   Recent Labs Lab 04/18/13 0545 04/18/13 1448 04/19/13 0427  NA 137 140 143  K 3.6 3.9 3.2*  CL 106 108 108  CO2 21 23 26   BUN 39* 38* 41*  CREATININE 0.87 0.94 1.06  CALCIUM 8.2* 8.4 8.6  MG  --  2.2  --   GLUCOSE 97 86 95    CBG (last 3)  No results found for this basename: GLUCAP,  in the last 72 hours  Scheduled Meds: . antiseptic oral rinse  1 application Mouth Rinse QID  . chlorhexidine  15 mL Mouth/Throat BID  . enoxaparin (LOVENOX) injection  40 mg Subcutaneous Daily  . feeding supplement (VITAL AF 1.2 CAL)  1,000 mL Per Tube Q24H  . pantoprazole sodium  40 mg Per Tube Daily  . piperacillin-tazobactam (ZOSYN)  IV  3.375 g Intravenous Q8H  . potassium chloride  10 mEq Intravenous Q1 Hr x 4  . potassium chloride  40 mEq Per Tube Once    Continuous Infusions: . sodium chloride 10 mL/hr at 04/18/13 0919  . fentaNYL infusion INTRAVENOUS 75 mcg/hr (04/19/13 0112)    Levon Hedger MS, RD, LDN [phone]  Pager [phone] After Hours Pager

## 2013-04-19 NOTE — Progress Notes (Signed)
[facility] ADULT ICU REPLACEMENT PROTOCOL FOR AM LAB REPLACEMENT ONLY  The patient does apply for the [facility] Adult ICU Electrolyte Replacment Protocol based on the criteria listed below:   1. Is GFR >/= 40 ml/min? yes  Patient's GFR today is 62 2. Is urine output >/= 0.5 ml/kg/hr for the last 6 hours? yes Patient's UOP is 1.3 ml/kg/hr 3. Is BUN < 60 mg/dL? yes  Patient's BUN today is 41 4. Abnormal electrolyte(s):K 3.2 5. Ordered repletion with: per protocol 6. If a panic level lab has been reported, has the CCM MD in charge been notified? no.   Physician:    Markus Daft A 04/19/2013 5:19 AM

## 2013-04-19 NOTE — Progress Notes (Signed)
Patient ID: KALED ALLENDE, male   DOB: 14-Mar-1926, 79 y.o.   MRN: [id number] 4 Days Post-Op  Subjective: Pt awake on vent.  Says he's passed gas.  Denies pain.  Objective: Vital signs in last 24 hours: Temp:  [97.9 F (36.6 C)-99.8 F (37.7 C)] 99.5 F (37.5 C) (12/17 0351) Pulse Rate:  [28-94] 94 (12/17 0700) Resp:  [18-40] 18 (12/17 0700) SpO2:  [93 %-100 %] 100 % (12/17 0700) FiO2 (%):  [60 %] 60 % (12/17 0700) Weight:  [168 lb 6.9 oz (76.4 kg)] 168 lb 6.9 oz (76.4 kg) (12/17 0611)    Intake/Output from previous day: 12/16 0701 - 12/17 0700 In: 1006.8 [I.V.:576.8; NG/GT:30; IV Piggyback:400] Out: 4890 [Urine:4840; Emesis/NG output:50] Intake/Output this shift:    PE: Abd: soft, few BS, NT except around incision.  Incision c/d/i with staples present.   Lab Results:   Recent Labs  04/18/13 0545 04/19/13 0427  WBC 16.3* 18.2*  HGB 11.5* 10.5*  HCT 34.5* 31.5*  PLT 98* 76*   BMET  Recent Labs  04/18/13 1448 04/19/13 0427  NA 140 143  K 3.9 3.2*  CL 108 108  CO2 23 26  GLUCOSE 86 95  BUN 38* 41*  CREATININE 0.94 1.06  CALCIUM 8.4 8.6   PT/INR No results found for this basename: LABPROT, INR,  in the last 72 hours CMP     Component Value Date/Time   NA 143 04/19/2013 0427   K 3.2* 04/19/2013 0427   CL 108 04/19/2013 0427   CO2 26 04/19/2013 0427   GLUCOSE 95 04/19/2013 0427   BUN 41* 04/19/2013 0427   CREATININE 1.06 04/19/2013 0427   CALCIUM 8.6 04/19/2013 0427   PROT 5.3* 04/19/2013 0427   ALBUMIN 1.8* 04/19/2013 0427   AST 21 04/19/2013 0427   ALT 13 04/19/2013 0427   ALKPHOS 94 04/19/2013 0427   BILITOT 1.2 04/19/2013 0427   GFRNONAA 62* 04/19/2013 0427   GFRAA 72* 04/19/2013 0427   Lipase  No results found for this basename: lipase       Studies/Results: Dg Chest Port 1 View  04/19/2013   CLINICAL DATA:  Hypoxia  EXAM: PORTABLE CHEST - 1 VIEW  COMPARISON:  April 18, 2013  FINDINGS: Endotracheal tube tip is 6.7 cm above the  carina. Nasogastric tube tip and side port are below the diaphragm. Central catheter tip is in the superior vena cava. No pneumothorax. There is widespread interstitial and patchy alveolar edema, stable. No new opacity. Heart size and pulmonary vascularity are normal. No adenopathy.  IMPRESSION: Bilateral interstitial and patchy alveolar opacity remain stable. Question a degree of ARDS.  Tube and catheter positions as described.  No pneumothorax.   Electronically Signed   By: Bretta Bang M.D.   On: 04/19/2013 07:13   Dg Chest Port 1 View  04/18/2013   CLINICAL DATA:  Followup lung disease  EXAM: PORTABLE CHEST - 1 VIEW  COMPARISON:  04/17/2013  FINDINGS: Cardiomediastinal silhouette is stable. Stable NG tube and endotracheal tube position. Stable left IJ central line position. Diffuse bilateral airspace disease again noted without change in aeration.  IMPRESSION: Stable support apparatus. Diffuse bilateral airspace disease again noted without change in aeration.   Electronically Signed   By: Natasha Mead M.D.   On: 04/18/2013 08:34    Anti-infectives: Anti-infectives   Start     Dose/Rate Route Frequency Ordered Stop   04/16/13 0600  vancomycin (VANCOCIN) IVPB 750 mg/150 ml premix  Status:  Discontinued  750 mg 150 mL/hr over 60 Minutes Intravenous Every 12 hours 04/24/2013 1400 04/17/13 0858   04/29/2013 2200  piperacillin-tazobactam (ZOSYN) IVPB 3.375 g     3.375 g 12.5 mL/hr over 240 Minutes Intravenous 3 times per day 04/16/2013 1400     04/10/2013 1530  vancomycin (VANCOCIN) 1,406 mg in sodium chloride 0.9 % 500 mL IVPB  Status:  Discontinued     20 mg/kg  70.3 kg 250 mL/hr over 120 Minutes Intravenous  Once 04/29/2013 1525 04/26/2013 1525   04/27/2013 1530  piperacillin-tazobactam (ZOSYN) IVPB 3.375 g  Status:  Discontinued     3.375 g 100 mL/hr over 30 Minutes Intravenous  Once 04/26/2013 1525 04/11/2013 1525   04/19/2013 1430  [MAR Hold]  vancomycin (VANCOCIN) IVPB 1000 mg/200 mL premix     (On MAR  Hold since 04/29/2013 1630)   1,000 mg 200 mL/hr over 60 Minutes Intravenous STAT 04/22/2013 1358 04/23/2013 1632   05/03/2013 1430  piperacillin-tazobactam (ZOSYN) IVPB 3.375 g     3.375 g 100 mL/hr over 30 Minutes Intravenous STAT 04/17/2013 1358 04/13/2013 1514     Assessment/Plan  1. POD 4, s/p open repair of right femoral hernia - A. Thomas 2. SBO, resolved with #1  3. Post op ileus, resolving 4. A fib with RVR, converted on amio  5. VDRF secondary to asp PNA  6. Leukocytosis 7. PCM/TF    Plan: 1. Appreciate CCM assistance with this patient.  They have offered to take hte patient on their service given his pulmonary issues.  This is greatly appreciated. 2. The patient's ileus seems to be resolving.  He is now passing flatus and has minimal NGT output.  We will start trickle TFs today.  These should not be advanced until we ok advancement. 3. Further care per CCM.  LOS: 4 days    OSBORNE,KELLY E 04/19/2013, 7:57 AM Pager: [phone]  Agree with above. Agree with starting "trickle" tube feedings.  Ovidio Kin, MD, [facility] [address] [facility] Pager: [phone] Office phone:  [phone]

## 2013-04-20 ENCOUNTER — Inpatient Hospital Stay (HOSPITAL_COMMUNITY): Payer: Medicare Other

## 2013-04-20 ENCOUNTER — Ambulatory Visit (HOSPITAL_BASED_OUTPATIENT_CLINIC_OR_DEPARTMENT_OTHER): Admission: RE | Admit: 2013-04-20 | Payer: Medicare Other | Source: Ambulatory Visit | Admitting: Surgery

## 2013-04-20 ENCOUNTER — Encounter (HOSPITAL_BASED_OUTPATIENT_CLINIC_OR_DEPARTMENT_OTHER): Admission: RE | Payer: Self-pay | Source: Ambulatory Visit

## 2013-04-20 DIAGNOSIS — N179 Acute kidney failure, unspecified: Secondary | ICD-10-CM

## 2013-04-20 LAB — CBC
Hemoglobin: 10.3 g/dL — ABNORMAL LOW (ref 13.0–17.0)
MCH: 26.5 pg (ref 26.0–34.0)
Platelets: 72 10*3/uL — ABNORMAL LOW (ref 150–400)
RBC: 3.88 MIL/uL — ABNORMAL LOW (ref 4.22–5.81)
RDW: 17.1 % — ABNORMAL HIGH (ref 11.5–15.5)

## 2013-04-20 LAB — BASIC METABOLIC PANEL
BUN: 48 mg/dL — ABNORMAL HIGH (ref 6–23)
CO2: 30 mEq/L (ref 19–32)
Calcium: 9 mg/dL (ref 8.4–10.5)
Chloride: 113 mEq/L — ABNORMAL HIGH (ref 96–112)
Creatinine, Ser: 1.09 mg/dL (ref 0.50–1.35)
GFR calc Af Amer: 73 mL/min — ABNORMAL LOW (ref >90)
GFR calc non Af Amer: 63 mL/min — ABNORMAL LOW (ref >90)
Glucose, Bld: 128 mg/dL — ABNORMAL HIGH (ref 70–99)
Glucose, Bld: 147 mg/dL — ABNORMAL HIGH (ref 70–99)
Potassium: 3.8 mEq/L (ref 3.5–5.1)
Sodium: 145 mEq/L (ref 135–145)

## 2013-04-20 LAB — GLUCOSE, CAPILLARY
Glucose-Capillary: 118 mg/dL — ABNORMAL HIGH (ref 70–99)
Glucose-Capillary: 121 mg/dL — ABNORMAL HIGH (ref 70–99)
Glucose-Capillary: 124 mg/dL — ABNORMAL HIGH (ref 70–99)
Glucose-Capillary: 146 mg/dL — ABNORMAL HIGH (ref 70–99)
Glucose-Capillary: 129 mg/dL — ABNORMAL HIGH (ref 70–99)
Glucose-Capillary: 137 mg/dL — ABNORMAL HIGH (ref 70–99)

## 2013-04-20 SURGERY — HERNIA REPAIR FEMORAL
Anesthesia: General | Laterality: Right

## 2013-04-20 MED ORDER — VANCOMYCIN HCL IN DEXTROSE 1-5 GM/200ML-% IV SOLN
1000.0000 mg | Freq: Once | INTRAVENOUS | Status: AC
  Administered 2013-04-20: 1000 mg via INTRAVENOUS
  Filled 2013-04-20: qty 200

## 2013-04-20 MED ORDER — POTASSIUM CHLORIDE 20 MEQ/15ML (10%) PO LIQD
20.0000 meq | ORAL | Status: DC
  Administered 2013-04-20: 20 meq
  Filled 2013-04-20 (×3): qty 15

## 2013-04-20 MED ORDER — FUROSEMIDE 10 MG/ML IJ SOLN
40.0000 mg | Freq: Once | INTRAMUSCULAR | Status: AC
  Administered 2013-04-20: 40 mg via INTRAVENOUS

## 2013-04-20 MED ORDER — POTASSIUM CHLORIDE 20 MEQ/15ML (10%) PO LIQD
40.0000 meq | ORAL | Status: AC
  Administered 2013-04-20: 40 meq
  Filled 2013-04-20: qty 30

## 2013-04-20 MED ORDER — VITAL AF 1.2 CAL PO LIQD
1000.0000 mL | ORAL | Status: DC
  Filled 2013-04-20 (×3): qty 1000

## 2013-04-20 MED ORDER — MIDAZOLAM HCL 2 MG/2ML IJ SOLN
4.0000 mg | Freq: Once | INTRAMUSCULAR | Status: AC
  Administered 2013-04-20: 4 mg via INTRAVENOUS

## 2013-04-20 MED ORDER — SODIUM CHLORIDE 0.9 % IV SOLN
500.0000 mg | Freq: Three times a day (TID) | INTRAVENOUS | Status: DC
  Administered 2013-04-20 – 2013-04-23 (×9): 500 mg via INTRAVENOUS
  Filled 2013-04-20 (×10): qty 500

## 2013-04-20 MED ORDER — METOPROLOL TARTRATE 1 MG/ML IV SOLN
5.0000 mg | Freq: Once | INTRAVENOUS | Status: AC
  Administered 2013-04-20: 5 mg via INTRAVENOUS

## 2013-04-20 MED ORDER — VANCOMYCIN HCL IN DEXTROSE 750-5 MG/150ML-% IV SOLN
750.0000 mg | Freq: Two times a day (BID) | INTRAVENOUS | Status: DC
  Administered 2013-04-20 – 2013-04-23 (×7): 750 mg via INTRAVENOUS
  Filled 2013-04-20 (×8): qty 150

## 2013-04-20 MED ORDER — MIDAZOLAM HCL 2 MG/2ML IJ SOLN
INTRAMUSCULAR | Status: AC
  Filled 2013-04-20: qty 4

## 2013-04-20 MED ORDER — POTASSIUM CHLORIDE 10 MEQ/100ML IV SOLN
INTRAVENOUS | Status: AC
  Filled 2013-04-20: qty 100

## 2013-04-20 NOTE — Progress Notes (Signed)
CARE MANAGEMENT NOTE 04/20/2013  Patient:  Casey Davila, Casey Davila   Account Number:  [id number]  Date Initiated:  04/17/2013  Documentation initiated by:  DAVIS,RHONDA  Subjective/Objective Assessment:   pt with surgical intervention for a incarcerated femorial hernia, unable to extubated post or     Action/Plan:   lives in the alf at the [place] home with home pt and an rn   Anticipated DC Date:  04/23/2013   Anticipated DC Plan:  SKILLED NURSING FACILITY  In-house referral  Clinical Social Worker      DC Associate Professor  CM consult      [facility] Choice  HOME HEALTH   Choice offered to / List presented to:  NA   DME arranged  NA      DME agency  NA     HH arranged  NA      HH agency  NA   Status of service:  In process, will continue to follow Medicare Important Message given?  NA - LOS <3 / Initial given by admissions (If response is "NO", the following Medicare IM given date fields will be blank) Date Medicare IM given:   Date Additional Medicare IM given:    Discharge Disposition:    Per UR Regulation:  Reviewed for med. necessity/level of care/duration of stay  If discussed at Long Length of Stay Meetings, dates discussed:   04/20/2013    Comments:  [id number] Earlene Plater, RN, BSN, [place] [phone] Chart Reviewed for discharge and hospital needs. Bronchscopy on [id number], vent , a.fib with rvr. Discharge needs at time of review:  None present will follow for needs. Review of patient progress due on [id number].   [id number] Earlene Plater RN, BSN, [place] [phone] Chart Reviewed for discharge and hospital needs. Discharge needs at time of review:  None present will follow for needs. From assisted living at the [facility] home but may need str or snf. Review of patient progress due on [id number].

## 2013-04-20 NOTE — Progress Notes (Signed)
Patient ID: Casey Davila, male   DOB: [birth year], 79 y.o.   MRN: [id number] 5 Days Post-Op  Subjective: Pt more sedated.  Running fever of 101 per RN.  RT states patient is struggling at times with trying to decrease vent settings.  Tolerating trickle tf with no residuals  Objective: Vital signs in last 24 hours: Temp:  [97.7 F (36.5 C)-99.6 F (37.6 C)] 99.3 F (37.4 C) (12/18 0400) Pulse Rate:  [83-149] 99 (12/18 0700) Resp:  [16-28] 19 (12/18 0700) SpO2:  [86 %-98 %] 91 % (12/18 0700) FiO2 (%):  [40 %-80 %] 55 % (12/18 0715) Weight:  [168 lb 6.9 oz (76.4 kg)] 168 lb 6.9 oz (76.4 kg) (12/18 0500)    Intake/Output from previous day: 12/17 0701 - 12/18 0700 In: 1569.3 [I.V.:699.3; NG/GT:620; IV Piggyback:250] Out: 2800 [Urine:2800] Intake/Output this shift:    PE: Abd: soft, NT, ND, +BS, incision in right groin c/d/i with staples  Lab Results:   Recent Labs  04/19/13 1416 04/20/13 0400  WBC 20.7* 17.2*  HGB 10.8* 10.3*  HCT 32.2* 31.7*  PLT 86* 72*   BMET  Recent Labs  04/19/13 0427 04/20/13 0400  NA 143 145  K 3.2* 3.4*  CL 108 108  CO2 26 30  GLUCOSE 95 128*  BUN 41* 45*  CREATININE 1.06 1.05  CALCIUM 8.6 9.0   PT/INR No results found for this basename: LABPROT, INR,  in the last 72 hours CMP     Component Value Date/Time   NA 145 04/20/2013 0400   K 3.4* 04/20/2013 0400   CL 108 04/20/2013 0400   CO2 30 04/20/2013 0400   GLUCOSE 128* 04/20/2013 0400   BUN 45* 04/20/2013 0400   CREATININE 1.05 04/20/2013 0400   CALCIUM 9.0 04/20/2013 0400   PROT 5.3* 04/19/2013 0427   ALBUMIN 1.8* 04/19/2013 0427   AST 21 04/19/2013 0427   ALT 13 04/19/2013 0427   ALKPHOS 94 04/19/2013 0427   BILITOT 1.2 04/19/2013 0427   GFRNONAA 63* 04/20/2013 0400   GFRAA 73* 04/20/2013 0400   Lipase  No results found for this basename: lipase       Studies/Results: Dg Chest Port 1 View  04/20/2013   CLINICAL DATA:  Evaluate endotracheal tube position.  EXAM:  PORTABLE CHEST - 1 VIEW  COMPARISON:  04/19/2013.  FINDINGS: The defibrillator pads a been removed. Endotracheal tube tip is at the level of the clavicles, 55 mm from the carina. Left IJ central line unchanged with the tip in the mid SVC. Enteric tube present. Monitoring leads project over the chest. Unchanged bilateral right greater than left basilar predominant airspace disease with more focal airspace disease in the right upper lobe. This is favored to represent asymmetric/ atypical pulmonary edema.  IMPRESSION: 1. Support apparatus in good position. 2. No interval change in bilateral basilar predominant airspace disease favored to represent edema.   Electronically Signed   By: Andreas Newport M.D.   On: 04/20/2013 07:06   Dg Chest Port 1 View  04/19/2013   CLINICAL DATA:  Hypoxia  EXAM: PORTABLE CHEST - 1 VIEW  COMPARISON:  April 18, 2013  FINDINGS: Endotracheal tube tip is 6.7 cm above the carina. Nasogastric tube tip and side port are below the diaphragm. Central catheter tip is in the superior vena cava. No pneumothorax. There is widespread interstitial and patchy alveolar edema, stable. No new opacity. Heart size and pulmonary vascularity are normal. No adenopathy.  IMPRESSION: Bilateral interstitial and patchy alveolar  opacity remain stable. Question a degree of ARDS.  Tube and catheter positions as described.  No pneumothorax.   Electronically Signed   By: Bretta Bang M.D.   On: 04/19/2013 07:13   Dg Chest Port 1 View  04/18/2013   CLINICAL DATA:  Followup lung disease  EXAM: PORTABLE CHEST - 1 VIEW  COMPARISON:  04/17/2013  FINDINGS: Cardiomediastinal silhouette is stable. Stable NG tube and endotracheal tube position. Stable left IJ central line position. Diffuse bilateral airspace disease again noted without change in aeration.  IMPRESSION: Stable support apparatus. Diffuse bilateral airspace disease again noted without change in aeration.   Electronically Signed   By: Natasha Mead M.D.    On: 04/18/2013 08:34   Dg Abd Portable 1v  04/19/2013   CLINICAL DATA:  OG tube placement.  EXAM: PORTABLE ABDOMEN - 1 VIEW  COMPARISON:  None.  FINDINGS: No gastric tube is appreciated coursing along the expected course of the stomach with tip projecting in the region of the distal stomach. The study is correlated with a prior CT dated 05/11/13. Air is seen within nondilated loops of small bowel. Mild levoscoliosis identified within the lumbar spine. Moderate to large amount of stool is appreciated.  IMPRESSION: Nonobstructive bowel gas pattern. OG tube described above. Moderate to large amount of stool.   Electronically Signed   By: Salome Holmes M.D.   On: 04/19/2013 12:40    Anti-infectives: Anti-infectives   Start     Dose/Rate Route Frequency Ordered Stop   04/16/13 0600  vancomycin (VANCOCIN) IVPB 750 mg/150 ml premix  Status:  Discontinued     750 mg 150 mL/hr over 60 Minutes Intravenous Every 12 hours May 11, 2013 1400 04/17/13 0858   2013/05/11 2200  piperacillin-tazobactam (ZOSYN) IVPB 3.375 g     3.375 g 12.5 mL/hr over 240 Minutes Intravenous 3 times per day 05/11/2013 1400     May 11, 2013 1530  vancomycin (VANCOCIN) 1,406 mg in sodium chloride 0.9 % 500 mL IVPB  Status:  Discontinued     20 mg/kg  70.3 kg 250 mL/hr over 120 Minutes Intravenous  Once May 11, 2013 1525 May 11, 2013 1525   2013-05-11 1530  piperacillin-tazobactam (ZOSYN) IVPB 3.375 g  Status:  Discontinued     3.375 g 100 mL/hr over 30 Minutes Intravenous  Once May 11, 2013 1525 05/11/2013 1525   May 11, 2013 1430  [MAR Hold]  vancomycin (VANCOCIN) IVPB 1000 mg/200 mL premix     (On MAR Hold since 05-11-13 1630)   1,000 mg 200 mL/hr over 60 Minutes Intravenous STAT 11-May-2013 1358 05-11-2013 1632   05-11-13 1430  piperacillin-tazobactam (ZOSYN) IVPB 3.375 g     3.375 g 100 mL/hr over 30 Minutes Intravenous STAT 2013-05-11 1358 2013-05-11 1514       Assessment/Plan 1. POD 5, s/p open repair of right femoral hernia - A. Thomas  2. SBO,  resolved with #1  3. Post op ileus, resolving  4. A fib with RVR, converted on amio  5. VDRF secondary to asp PNA  6. Leukocytosis  7. PCM/TF   Plan: 1. Will have dietician increase his TF as tolerates at this point.  If he has residuals will need to back off to decrease his risk of further aspiration. 2. Cont further medical care per CCM.  LOS: 5 days   OSBORNE,KELLY E 04/20/2013, 7:43 AM Pager: [phone]  Agree with above. Spoke with wife.  Patient sedated at this time. Increasing tube feedings.  Ovidio Kin, MD, [facility] Surgery Pager: [phone] Office phone:  [phone]  

## 2013-04-20 NOTE — Progress Notes (Addendum)
ANTIBIOTIC CONSULT NOTE - INITIAL  Pharmacy Consult for Vancomycin Indication: suspected pneumonia  Allergies  Allergen Reactions  . Vicodin [Hydrocodone-Acetaminophen] Nausea And Vomiting    Patient Measurements: Height: 5\' 11"  (180.3 cm) Weight: 168 lb 6.9 oz (76.4 kg) IBW/kg (Calculated) : 75.3  Vital Signs: Temp: 101.1 F (38.4 C) (12/18 0800) Temp src: Axillary (12/18 0800) Pulse Rate: 96 (12/18 0800)  Labs:  Recent Labs  04/18/13 1448 04/19/13 0427 04/19/13 1416 04/20/13 0400  WBC  --  18.2* 20.7* 17.2*  HGB  --  10.5* 10.8* 10.3*  PLT  --  76* 86* 72*  CREATININE 0.94 1.06  --  1.05    Assessment: 79yo M from [facility] with incarcerated femoral hernia s/p repair 12/13 and admitted to the ICU on ventilator. CXR on admission with possible PNA, V/Z started then narrowed down to Zosyn alone given likely aspiration PNA. Moderate klebsiella pneumoniae was cultured from sputum. This AM, pt spike a fever - adding back vancomycin, and replacing Zosyn with Imipenem for HCAP.   12/13 >> Zosyn >> 12/18 12/13 >> Vanc >> 12/15, resume 12/18 >>  12/18 >> Imipenem >>   Tmax: 101.1 this AM WBCs: hanging around 17K Renal: Scr 1.05, CG 55, N 52 12/18 PCT >> ordered  12/13 urine >> 7K insignificant growth 12/14 blood >> NGTD 12/14 sputum >> mod klebsiella pneumoniae (only resistant to ampicillin) 12/18 respiratory >> ordered  Today is D#6 Zosyn. CXR with stable patchy alveolar opacity, no change today. Plan bronch and culture.   Goal of Therapy:  Vancomycin trough level 15-20 mcg/ml  Plan:   Vancomycin 1g x 1 then 750 mg IV q12h  D/C Zosyn, start Imipenem 500 mg IV q8h  Pharmacy will f/u  Geoffry Paradise, PharmD, BCPS Pager: [phone] 9:02 AM Pharmacy #: [id number]

## 2013-04-20 NOTE — Progress Notes (Signed)
NUTRITION FOLLOW UP  Intervention:   - Plan, per surgery, is to advance TF of Vital AF 1.2 via OGT (started at 51ml/hr) by 10 ml every 8 hours as tolerated to goal of 40ml/hr. Goal rate of TF will provide 1872 calories, 117g protein, and free water and will meet 101% estimated calorie needs and 100% estimated protein needs. If IVF d/c, recommend water flushes 4 times/day. - Will continue to monitor   Nutrition Dx:   Inadequate oral intake related to inability to eat as evidenced by NPO - ongoing   Goal:   Initiation of TF with goal to meet >90% of estimated nutritional needs - TF initiation met, not at goal rate yet  Monitor:   Weights, labs, vent status, TF initiation/tolerance/advancement, BM  Assessment:   Admitted with abdominal pain and constipation x 3 days with nausea. Found to have small bowel obstruction with incarcerated femoral hernia which was surgically repaired 05/03/2013. During intubation for surgery he was found to have gastric content in his vocal cords. Had cardioversion yesterday for new onset A-fib with RVR in the setting of shock.   12/15 - Surgery following, noted plans for possible TF tomorrow. Pt with NGT in place with recent canister change, had some brown bilious fluid overnight with documented total output yesterday. Noted pt's weight up 20 pounds since admission and pt with +1 lower extremity edema.   12/17 - Per surgery's notes, pt's ileus seems to be resolving, pt passing flatus with minimal NGT output. Plan is to start trickle TF but to be advanced only per surgery. Pt's weight up 13 pounds since admission.   12/18 - Pt tolerated trickle TF with no residuals per surgery notes and per conversation with RN. Per surgery, small bowel obstruction has resolved and post-op ileus is resolving. Met with pt's wife in room, discussed nutrition plan. Wife reports pt has never been sick before. Emotional support provided.   Patient is currently intubated on  ventilator support.  MV: 9 L/min Temp (24hrs), Avg:99.4 F (37.4 C), Min:97.7 F (36.5 C), Max:101.1 F (38.4 C)  Propofol: off   Current TF: Vital AF 1.2 at 67ml/hr - provides 864 calories, 54g protein, free water which meets 46% of estimated calorie needs, 57% estimated protein needs  Potassium slightly low, getting IV and liquid replacement through OGT.   Height: Ht Readings from Last 1 Encounters:  04/06/2013 5\' 11"  (1.803 m)    Weight Status:   Wt Readings from Last 1 Encounters:  04/20/13 168 lb 6.9 oz (76.4 kg)  Admit wt:        155 lb (70.3 kg)  Net I/Os: +4.2L  Re-estimated needs:  Kcal: 1839 Protein: 95-120g Fluid: >1.7L/day  Skin: +1 RLE, LLE edema, abdominal incision   Diet Order: NPO   Intake/Output Summary (Last 24 hours) at 04/20/13 1029 Last data filed at 04/20/13 0900  Gross per 24 hour  Intake 1403.83 ml  Output   2385 ml  Net -981.17 ml    Last BM: PTA   Labs:   Recent Labs Lab 04/18/13 0545 04/18/13 1448 04/19/13 0427 04/20/13 0400  NA 137 140 143 145  K 3.6 3.9 3.2* 3.4*  CL 106 108 108 108  CO2 21 23 26 30   BUN 39* 38* 41* 45*  CREATININE 0.87 0.94 1.06 1.05  CALCIUM 8.2* 8.4 8.6 9.0  MG  --  2.2  --   --   GLUCOSE 97 86 95 128*    CBG (  last 3)   Recent Labs  04/19/13 2349 04/20/13 0418 04/20/13 0724  GLUCAP 118* 121* 124*    Scheduled Meds: . antiseptic oral rinse  1 application Mouth Rinse QID  . chlorhexidine  15 mL Mouth/Throat BID  . enoxaparin (LOVENOX) injection  40 mg Subcutaneous Daily  . furosemide  40 mg Intravenous Once  . imipenem-cilastatin  500 mg Intravenous Q8H  . midazolam      . pantoprazole sodium  40 mg Per Tube Daily  . vancomycin  1,000 mg Intravenous Once   Followed by  . vancomycin  750 mg Intravenous Q12H    Continuous Infusions: . sodium chloride 20 mL/hr at 04/20/13 0634  . feeding supplement (VITAL AF 1.2 CAL) 1,000 mL (04/20/13 0800)  . fentaNYL infusion INTRAVENOUS 75  mcg/hr (04/20/13 0143)    Levon Hedger MS, RD, LDN [phone] Pager [phone] After Hours Pager

## 2013-04-20 NOTE — Progress Notes (Signed)
[facility]  PULMONARY  / CRITICAL CARE MEDICINE  Name: Casey BOOHER MRN: [id number] DOB: 1925/05/29    ADMISSION DATE:  Apr 17, 2013 CONSULTATION DATE:  17-Apr-2013  REFERRING MD :  Romie Levee, MD PRIMARY SERVICE: Surgery  CHIEF COMPLAINT:   Post repair of incarcerated femoral hernia. Out of the OR intubated.   BRIEF PATIENT DESCRIPTION:  79 years old male with PMH relevant for HTN.  Underwent emergent repair and now out of the OR intubated in the ICU. CCM consult called to assist with ventilator management.   SIGNIFICANT EVENTS / STUDIES:  12/14 CT angio chest/ab/pelv> esophagus full of fluid? (thick), nearly occludes left mainstem with external compression; left lower lung pulm nodule; aspiration pneumonitis bilateral base, SBO, R femoral hernia 12/14 afib with rvr/shock, attempted cardioversion x3 unsuccessful  LINES / TUBES: 12/14 L IJ CVL >> 12/13 ETT >>  CULTURES: 12/14 resp: pan sens Klebsiella PNA  12/14 blood >> 12/18 BAL>>>  ANTIBIOTICS: 12/13 vanc >>12/15 12/13 zosyn >> 12/18 vanc>>>  SUBJECTIVE:  Comfortable on vent, awake and alert, off pressors, diuresing   VITAL SIGNS: Temp:  [97.7 F (36.5 C)-101.1 F (38.4 C)] 101.1 F (38.4 C) (12/18 0800) Pulse Rate:  [83-149] 96 (12/18 0800) Resp:  [16-28] 18 (12/18 0800) SpO2:  [86 %-97 %] 90 % (12/18 0800) FiO2 (%):  [40 %-80 %] 55 % (12/18 0800) Weight:  [76.4 kg (168 lb 6.9 oz)] 76.4 kg (168 lb 6.9 oz) (12/18 0500) HEMODYNAMICS: CVP:  [2 mmHg-9 mmHg] 8 mmHg VENTILATOR SETTINGS: Vent Mode:  [-] PRVC FiO2 (%):  [40 %-80 %] 55 % Set Rate:  [18 bmp] 18 bmp Vt Set:  [450 mL] 450 mL PEEP:  [8 cmH20-10 cmH20] 10 cmH20 Plateau Pressure:  [19 cmH20-22 cmH20] 22 cmH20 INTAKE / OUTPUT: Intake/Output     12/17 0701 - 12/18 0700 12/18 0701 - 12/19 0700   I.V. (mL/kg) 699.3 (9.2) 20 (0.3)   NG/GT 620 30   IV Piggyback 250    Total Intake(mL/kg) 1569.3 (20.5) 50 (0.7)   Urine (mL/kg/hr) 2800 (1.5)    Emesis/NG output      Total Output 2800     Net -1230.7 +50          PHYSICAL EXAMINATION: General: comfortable on vent HEENT: NCAT, ETT in place PULM: Decreased occ rhonchi  CV: RRR, no mgr, NSR on tele  AB: BS noted, non-tender Ext: warm, no edema, SCD's Neuro: arouses easily, follows commands  LABS:  CBC  Recent Labs Lab 04/19/13 0427 04/19/13 1416 04/20/13 0400  WBC 18.2* 20.7* 17.2*  HGB 10.5* 10.8* 10.3*  HCT 31.5* 32.2* 31.7*  PLT 76* 86* 72*   Coag's No results found for this basename: APTT, INR,  in the last 168 hours BMET  Recent Labs Lab 04/18/13 1448 04/19/13 0427 04/20/13 0400  NA 140 143 145  K 3.9 3.2* 3.4*  CL 108 108 108  CO2 23 26 30   BUN 38* 41* 45*  CREATININE 0.94 1.06 1.05  GLUCOSE 86 95 128*   Electrolytes  Recent Labs Lab 04/18/13 1448 04/19/13 0427 04/20/13 0400  CALCIUM 8.4 8.6 9.0  MG 2.2  --   --    Sepsis Markers  Recent Labs Lab 04/17/13 1235 Apr 17, 2013 2300  LATICACIDVEN 7.0* 5.0*   ABG  Recent Labs Lab 04/17/13 1057 04/18/13 0358 04/19/13 0342  PHART 7.343* 7.329* 7.360  PCO2ART 37.9 40.4 46.4*  PO2ART 50.9* 61.3* 71.3*   Liver Enzymes  Recent Labs Lab 04-17-2013 1235 04/19/13  0427  AST 28 21  ALT 19 13  ALKPHOS 68 94  BILITOT 1.3* 1.2  ALBUMIN 3.7 1.8*   Cardiac Enzymes  Recent Labs Lab 04/14/2013 1235  TROPONINI <0.30  PROBNP 448.0   Glucose  Recent Labs Lab 04/19/13 1203 04/19/13 1546 04/19/13 2008 04/19/13 2349 04/20/13 0418 04/20/13 0724  GLUCAP 95 107* 111* 118* 121* 124*     Imaging  CXR: 12/18 CXR R>L airspace disease,no sig change when c/w 12/17, perhaps a little better aeration   EKG: NSR w/ occ PAC  ASSESSMENT / PLAN:  PULMONARY A: 1) Acute hypoxemic respiratory failure due to aspiration pneumonitis/ ARDS CXR w/out sig change. Still w/ diffuse airspace disease and High FIO2/PEEP requirements.  P:   -Full vent support -Cont TVol @ 6cc/kg -See ID section, will get BAL and widen  abx 12/18 -send ESR, PCT - if FIO2 requirements remain high thru weekend will get CT chest on Monday  -target PaO2 55-65 -ABG/CXR/WUA/SBT 12/19 am  -cont diuresis ALA FACT approach   CARDIOVASCULAR A:  1) Septic shock > resolved 2) New onset afib with RVR due to sepsis and levophed, failed cardioversion; converted with amiodarone, Now NSR w/ occ PAC, had Run of SVT 12/19 P:  PRN lopressor Empirically replace KCL F/u Mg KVO IVFs Think we can avoid full dose anticoagulation for now, but if goes back into AF will need to consider  RENAL A:   Acute renal failure> resolved > 4.5 liters net neg last 48 hrs. Scr tolerating Hypokalemia  P:   Lasix as tolerated, will do 1 dose 12/18 aiming for 1.5 net neg. W/ goal to avoid further creatinine rise but cont fluid removal.  F/u creatinine in am  KVO IVF  Replace KCL  GASTROINTESTINAL A:   1) R femoral hernia and SBO, viable bowel; Status post surgical repair P:   - GI prophylaxis with protonix - slow advance tubefeeds   HEMATOLOGIC A:   mild thrombocytopenia (dropped 50%) Plts holding in the 70-80 range. Changed to LMWH on 12/16.  Mild elevation in WBC  P:  - Will follow CBC, monitor for bleeding  - f/u HIT panel, if neg consider alternative etiology: medication vs possible occult infection   INFECTIOUS A:   1) Aspiration PNA + Klebsiella PNA (Pan sens)  Rising fever curve w/ no sig change in O2 requirements 12/18 P:   - Zosyn -widen ABX -cycle PCT -Bronch/BAL  ENDOCRINE A:   1) No issues    NEUROLOGIC A:   1) Intubated, sedated P:   - Sedation and pain management with fentanyl - RASS score goal of -1   BABCOCK,PETE  Attending:  I have seen and examined the patient with nurse practitioner/resident and agree with the note above.   Oxygenation worse, bronch today revealed thick mucus plugging of basilar segments of RLL Klebsiella on trach aspirate but agree with broadening out antibiotics given rising WBC  yesterday and fever overnight; do not suspect abdominal infection given improved bowel sounds, normal exam   I have personally obtained a history, examined the patient, evaluated laboratory and imaging results, formulated the assessment and plan and placed orders. CRITICAL CARE: Critical Care Time devoted to patient care services described in this note is 45 minutes.   Yolonda Kida PCCM Pager: [phone] Cell: [phone] If no response, call [phone]

## 2013-04-20 NOTE — Procedures (Signed)
PCCM Bronchoscopy Procedure Note  The patient was informed of the risks (including but not limited to bleeding, infection, respiratory failure, lung injury, tooth/oral injury) and benefits of the procedure and gave consent, see chart.  Indication: worsening hypoxemia, HCAP  Location: [facility] ICU room 1232  Condition pre procedure: critically ill, sedated on vent  Medications for procedure: versed 4mg  IV, fentanyl gtt  Procedure description: The bronchoscope was introduced through the endotracheal tube and passed to the bilateral lungs to the level of the subsegmental bronchi throughout the tracheobronchial tree.  Airway exam revealed some erythema and acute inflammation at the tip of the endotracheal tube in the trachea.  This did not cause narrowing.  The left tracheobronchial tree was inspected to the subsegmental level and was normal in appearance without lesion and there were only scant thin secretions.   The right lung was inspected tot he subsegmental level.  There were thick, brown secretions obstructing all basilar segments of the RLL.  With some difficulty I was able to clear these secretions with repeated passes with the bronchoscope and using saline washing.  At the completion of the procedure we performed a BAL in the RML.  Cloudy secretions returned.  We stopped the procedure due to A-fib with RVR which has been an intermittent problem all week.  Procedures performed: 1) Therapeutic aspiration of the RLL; 2) BAL RML (20cc injected, 15 cc returned)  Specimens sent: 1) BAL > quantitative culture, viral culture and viral pcr  Condition post procedure: critically ill, sedated on vent  Complications: none  EBL: minimal  Yolonda Kida PCCM Pager: [phone] Cell: [phone] If no response, call [phone]

## 2013-04-20 NOTE — Progress Notes (Signed)
[facility] ADULT ICU REPLACEMENT PROTOCOL FOR AM LAB REPLACEMENT ONLY  The patient does apply for the [facility] Adult ICU Electrolyte Replacment Protocol based on the criteria listed below:   1. Is GFR >/= 40 ml/min? yes  Patient's GFR today is 63 2. Is urine output >/= 0.5 ml/kg/hr for the last 6 hours? yes Patient's UOP is 0.9 ml/kg/hr 3. Is BUN < 60 mg/dL? yes  Patient's BUN today is 45 4. Abnormal electrolyte(s):K 3.4 5. Ordered repletion with: per protocol 6. If a panic level lab has been reported, has the CCM MD in charge been notified? no.   Physician:    Markus Daft A 04/20/2013 6:08 AM

## 2013-04-21 ENCOUNTER — Inpatient Hospital Stay (HOSPITAL_COMMUNITY): Payer: Medicare Other

## 2013-04-21 DIAGNOSIS — E870 Hyperosmolality and hypernatremia: Secondary | ICD-10-CM

## 2013-04-21 LAB — CBC
HCT: 30.8 % — ABNORMAL LOW (ref 39.0–52.0)
Hemoglobin: 9.8 g/dL — ABNORMAL LOW (ref 13.0–17.0)
MCHC: 31.8 g/dL (ref 30.0–36.0)
MCV: 83.2 fL (ref 78.0–100.0)
Platelets: 71 10*3/uL — ABNORMAL LOW (ref 150–400)
RBC: 3.7 MIL/uL — ABNORMAL LOW (ref 4.22–5.81)
RDW: 17.2 % — ABNORMAL HIGH (ref 11.5–15.5)
WBC: 14.5 10*3/uL — ABNORMAL HIGH (ref 4.0–10.5)

## 2013-04-21 LAB — HEPARIN INDUCED THROMBOCYTOPENIA PNL
Heparin Induced Plt Ab: NEGATIVE
UFH Low Dose 0.1 IU/mL: 2 % Release
UFH Low Dose 0.5 IU/mL: 1 % Release
UFH SRA Result: NEGATIVE

## 2013-04-21 LAB — GLUCOSE, CAPILLARY
Glucose-Capillary: 123 mg/dL — ABNORMAL HIGH (ref 70–99)
Glucose-Capillary: 126 mg/dL — ABNORMAL HIGH (ref 70–99)
Glucose-Capillary: 135 mg/dL — ABNORMAL HIGH (ref 70–99)

## 2013-04-21 LAB — COMPREHENSIVE METABOLIC PANEL
Albumin: 1.6 g/dL — ABNORMAL LOW (ref 3.5–5.2)
Alkaline Phosphatase: 111 U/L (ref 39–117)
BUN: 52 mg/dL — ABNORMAL HIGH (ref 6–23)
Chloride: 116 mEq/L — ABNORMAL HIGH (ref 96–112)
Creatinine, Ser: 1.04 mg/dL (ref 0.50–1.35)
GFR calc Af Amer: 73 mL/min — ABNORMAL LOW (ref >90)
Glucose, Bld: 146 mg/dL — ABNORMAL HIGH (ref 70–99)
Potassium: 3.9 mEq/L (ref 3.5–5.1)
Sodium: 152 mEq/L — ABNORMAL HIGH (ref 135–145)
Total Bilirubin: 3 mg/dL — ABNORMAL HIGH (ref 0.3–1.2)

## 2013-04-21 LAB — PROCALCITONIN: Procalcitonin: 2.75 ng/mL

## 2013-04-21 MED ORDER — SODIUM CHLORIDE 0.45 % IV SOLN
INTRAVENOUS | Status: DC
  Administered 2013-04-21: 11:00:00 via INTRAVENOUS
  Administered 2013-04-23: 20 mL/h via INTRAVENOUS
  Administered 2013-04-26: 02:00:00 via INTRAVENOUS

## 2013-04-21 MED ORDER — FREE WATER
300.0000 mL | Status: DC
  Administered 2013-04-21 – 2013-04-22 (×5): 300 mL

## 2013-04-21 MED ORDER — ACETAMINOPHEN 160 MG/5ML PO SOLN
650.0000 mg | ORAL | Status: DC | PRN
  Administered 2013-04-21 – 2013-04-23 (×3): 650 mg
  Filled 2013-04-21 (×4): qty 20.3

## 2013-04-21 MED ORDER — ALBUTEROL SULFATE (5 MG/ML) 0.5% IN NEBU
2.5000 mg | INHALATION_SOLUTION | Freq: Four times a day (QID) | RESPIRATORY_TRACT | Status: DC | PRN
  Administered 2013-04-22 – 2013-04-23 (×6): 2.5 mg via RESPIRATORY_TRACT
  Filled 2013-04-21 (×6): qty 0.5

## 2013-04-21 MED ORDER — ACETYLCYSTEINE 20 % IN SOLN
2.0000 mL | Freq: Four times a day (QID) | RESPIRATORY_TRACT | Status: AC
  Administered 2013-04-22 – 2013-04-23 (×6): 2 mL via RESPIRATORY_TRACT
  Filled 2013-04-21 (×21): qty 4

## 2013-04-21 MED ORDER — VITAL AF 1.2 CAL PO LIQD
1000.0000 mL | ORAL | Status: DC
  Administered 2013-04-21: 1000 mL
  Filled 2013-04-21 (×3): qty 1000

## 2013-04-21 NOTE — Progress Notes (Signed)
Patient ID: Casey Davila, male   DOB: June 29, 1925, 79 y.o.   MRN: [id number] 6 Days Post-Op  Subjective: Pt asleep this morning on vent.  Had 300cc residual at last check.  Objective: Vital signs in last 24 hours: Temp:  [98.4 F (36.9 C)-101.1 F (38.4 C)] 99.3 F (37.4 C) (12/19 0400) Pulse Rate:  [78-149] 100 (12/19 0700) Resp:  [16-24] 16 (12/19 0700) BP: (89-136)/(41-69) 126/52 mmHg (12/19 0700) SpO2:  [90 %-97 %] 93 % (12/19 0700) FiO2 (%):  [55 %-80 %] 60 % (12/19 0728) Weight:  [168 lb 6.9 oz (76.4 kg)] 168 lb 6.9 oz (76.4 kg) (12/19 0500)    Intake/Output from previous day: 12/18 0701 - 12/19 0700 In: 2310.2 [I.V.:605.8; NG/GT:1054.3; IV Piggyback:650] Out: 1795 [Urine:1795] Intake/Output this shift:    PE: Abd: soft, NT, ND, +BS, incision c/d/iw with staples.  Some scrotal edema noted today.  Lab Results:   Recent Labs  04/20/13 0400 04/21/13 0500  WBC 17.2* 14.5*  HGB 10.3* 9.8*  HCT 31.7* 30.8*  PLT 72* 71*   BMET  Recent Labs  04/20/13 1405 04/21/13 0500  NA 150* 152*  K 3.8 3.9  CL 113* 116*  CO2 30 30  GLUCOSE 147* 146*  BUN 48* 52*  CREATININE 1.09 1.04  CALCIUM 8.8 8.8   PT/INR No results found for this basename: LABPROT, INR,  in the last 72 hours CMP     Component Value Date/Time   NA 152* 04/21/2013 0500   K 3.9 04/21/2013 0500   CL 116* 04/21/2013 0500   CO2 30 04/21/2013 0500   GLUCOSE 146* 04/21/2013 0500   BUN 52* 04/21/2013 0500   CREATININE 1.04 04/21/2013 0500   CALCIUM 8.8 04/21/2013 0500   PROT 5.1* 04/21/2013 0500   ALBUMIN 1.6* 04/21/2013 0500   AST 24 04/21/2013 0500   ALT 14 04/21/2013 0500   ALKPHOS 111 04/21/2013 0500   BILITOT 3.0* 04/21/2013 0500   GFRNONAA 63* 04/21/2013 0500   GFRAA 73* 04/21/2013 0500   Lipase  No results found for this basename: lipase       Studies/Results: Dg Chest Port 1 View  04/21/2013   CLINICAL DATA:  Intubation.  EXAM: PORTABLE CHEST - 1 VIEW  COMPARISON:  04/20/2013.   FINDINGS: Endotracheal tube, left IJ line, NG tube are in position. Persistent unchanged bilateral pulmonary infiltrates noted. These findings are most consistent with bilateral pneumonia. Pulmonary edema cannot be excluded. There is no significant cardiomegaly or pulmonary venous congestion. No pneumothorax. No acute osseous abnormality  IMPRESSION: 1. Stable line and tube positions. 2. Persistent unchanged bilateral pulmonary infiltrates, most likely related to pneumonia.   Electronically Signed   By: Maisie Fus  Register   On: 04/21/2013 07:33   Dg Chest Port 1 View  04/20/2013   CLINICAL DATA:  Evaluate endotracheal tube position.  EXAM: PORTABLE CHEST - 1 VIEW  COMPARISON:  04/19/2013.  FINDINGS: The defibrillator pads a been removed. Endotracheal tube tip is at the level of the clavicles, 55 mm from the carina. Left IJ central line unchanged with the tip in the mid SVC. Enteric tube present. Monitoring leads project over the chest. Unchanged bilateral right greater than left basilar predominant airspace disease with more focal airspace disease in the right upper lobe. This is favored to represent asymmetric/ atypical pulmonary edema.  IMPRESSION: 1. Support apparatus in good position. 2. No interval change in bilateral basilar predominant airspace disease favored to represent edema.   Electronically Signed   By:  Andreas Newport M.D.   On: 04/20/2013 07:06   Dg Abd Portable 1v  04/19/2013   CLINICAL DATA:  OG tube placement.  EXAM: PORTABLE ABDOMEN - 1 VIEW  COMPARISON:  None.  FINDINGS: No gastric tube is appreciated coursing along the expected course of the stomach with tip projecting in the region of the distal stomach. The study is correlated with a prior CT dated 04/16/2013. Air is seen within nondilated loops of small bowel. Mild levoscoliosis identified within the lumbar spine. Moderate to large amount of stool is appreciated.  IMPRESSION: Nonobstructive bowel gas pattern. OG tube described above.  Moderate to large amount of stool.   Electronically Signed   By: Salome Holmes M.D.   On: 04/19/2013 12:40    Anti-infectives: Anti-infectives   Start     Dose/Rate Route Frequency Ordered Stop   04/20/13 2200  vancomycin (VANCOCIN) IVPB 750 mg/150 ml premix     750 mg 150 mL/hr over 60 Minutes Intravenous Every 12 hours 04/20/13 0903     04/20/13 1200  imipenem-cilastatin (PRIMAXIN) 500 mg in sodium chloride 0.9 % 100 mL IVPB     500 mg 200 mL/hr over 30 Minutes Intravenous Every 8 hours 04/20/13 1009     04/20/13 0930  vancomycin (VANCOCIN) IVPB 1000 mg/200 mL premix     1,000 mg 200 mL/hr over 60 Minutes Intravenous  Once 04/20/13 0903 04/20/13 1100   04/16/13 0600  vancomycin (VANCOCIN) IVPB 750 mg/150 ml premix  Status:  Discontinued     750 mg 150 mL/hr over 60 Minutes Intravenous Every 12 hours 04/06/2013 1400 04/17/13 0858   04/17/2013 2200  piperacillin-tazobactam (ZOSYN) IVPB 3.375 g  Status:  Discontinued     3.375 g 12.5 mL/hr over 240 Minutes Intravenous 3 times per day 05/01/2013 1400 04/20/13 0947   04/12/2013 1530  vancomycin (VANCOCIN) 1,406 mg in sodium chloride 0.9 % 500 mL IVPB  Status:  Discontinued     20 mg/kg  70.3 kg 250 mL/hr over 120 Minutes Intravenous  Once 04/14/2013 1525 04/06/2013 1525   04/29/2013 1530  piperacillin-tazobactam (ZOSYN) IVPB 3.375 g  Status:  Discontinued     3.375 g 100 mL/hr over 30 Minutes Intravenous  Once 04/14/2013 1525 04/07/2013 1525   04/20/2013 1430  [MAR Hold]  vancomycin (VANCOCIN) IVPB 1000 mg/200 mL premix     (On MAR Hold since 04/24/2013 1630)   1,000 mg 200 mL/hr over 60 Minutes Intravenous STAT 05/01/2013 1358 04/22/2013 1632   04/03/2013 1430  piperacillin-tazobactam (ZOSYN) IVPB 3.375 g     3.375 g 100 mL/hr over 30 Minutes Intravenous STAT 04/03/2013 1358 04/17/2013 1514       Assessment/Plan 1. POD 6, s/p open repair of right femoral hernia - A. Thomas  2. SBO, resolved with #1  3. Post op ileus, resolving  4. A fib with RVR, converted  on amio  5. VDRF secondary to asp PNA  6. Leukocytosis  7. PCM/TF 8. S/p bronchoscopy  Plan: 1. Patient had 300cc residual prior to advancing from 40 to 50cc/hr.  Patient currently at 250cc residual.  Will hold TF at 50cc today.  There is no need to rush the patient to his goal.  I do not want to put him at higher risk of aspiration given multiple prior aspirations and current pulmonary status.  If patient's residuals decrease, then we can advance to goal tomorrow. 2. Otherwise, the patient is surgically stable. 3. Medical events from yesterday noted.  Further care per CCM.  LOS: 6 days    Casey Davila,Casey Davila 04/21/2013, 7:40 AM Pager: [phone]  Agree with above. TF at 50 cc/hr.   Wife in room.  Casey Kin, MD, [facility] Surgery Pager: [phone] Office phone:  [phone]

## 2013-04-21 NOTE — Progress Notes (Signed)
.   Name: Casey Davila MRN: [id number] DOB: 11/13/1925    ADMISSION DATE:  04/19/2013 CONSULTATION DATE:  04/19/13  REFERRING MD :  Romie Levee, MD PRIMARY SERVICE: Surgery  CHIEF COMPLAINT:   Post repair of incarcerated femoral hernia. Out of the OR intubated.   BRIEF PATIENT DESCRIPTION:  79 years old male with PMH relevant for HTN.  Underwent emergent repair and now out of the OR intubated in the ICU. CCM consult called to assist with ventilator management.   SIGNIFICANT EVENTS / STUDIES:  12/14 CT angio chest/ab/pelv> esophagus full of fluid? (thick), nearly occludes left mainstem with external compression; left lower lung pulm nodule; aspiration pneumonitis bilateral base, SBO, R femoral hernia 12/14 afib with rvr/shock, attempted cardioversion x3 unsuccessful 12/18 FOB, copious thick secretions  LINES / TUBES: 12/14 L IJ CVL >> 12/13 ETT >>  CULTURES: 12/14 resp: pan sens Klebsiella PNA  12/14 blood >> 12/18 BAL>>>  ANTIBIOTICS: 12/13 vanc >>12/15 12/13 zosyn >>12/18 12/18 primaxin>>> 12/18 vanc>>>  SUBJECTIVE:   No sig change   VITAL SIGNS: Temp:  [98.4 F (36.9 C)-99.4 F (37.4 C)] 99.4 F (37.4 C) (12/19 0800) Pulse Rate:  [78-149] 105 (12/19 0800) Resp:  [16-24] 21 (12/19 0800) BP: (89-138)/(41-69) 138/50 mmHg (12/19 0800) SpO2:  [91 %-97 %] 92 % (12/19 0800) FiO2 (%):  [60 %-80 %] 60 % (12/19 0800) Weight:  [76.4 kg (168 lb 6.9 oz)] 76.4 kg (168 lb 6.9 oz) (12/19 0500) HEMODYNAMICS: CVP:  [7 mmHg-16 mmHg] 16 mmHg VENTILATOR SETTINGS: Vent Mode:  [-] PRVC FiO2 (%):  [60 %-80 %] 60 % Set Rate:  [18 bmp] 18 bmp Vt Set:  [450 mL] 450 mL PEEP:  [10 cmH20] 10 cmH20 Plateau Pressure:  [14 cmH20-23 cmH20] 23 cmH20 INTAKE / OUTPUT: Intake/Output     12/18 0701 - 12/19 0700 12/19 0701 - 12/20 0700   I.V. (mL/kg) 633.3 (8.3) 26.4 (0.3)   NG/GT 1104.3 100   IV Piggyback 650    Total Intake(mL/kg) 2387.7 (31.3) 126.4 (1.7)   Urine (mL/kg/hr) 1795  (1) 180 (0.9)   Total Output 1795 180   Net +592.7 -53.6          PHYSICAL EXAMINATION: General: comfortable on vent HEENT: NCAT, ETT in place PULM: Decreased t/o CV: RRR, no mgr, NSR on tele  AB: BS noted, non-tender Ext: warm, no edema, SCD's Neuro: arouses easily, follows commands  LABS:  CBC  Recent Labs Lab 04/19/13 1416 04/20/13 0400 04/21/13 0500  WBC 20.7* 17.2* 14.5*  HGB 10.8* 10.3* 9.8*  HCT 32.2* 31.7* 30.8*  PLT 86* 72* 71*   Coag's No results found for this basename: APTT, INR,  in the last 168 hours BMET  Recent Labs Lab 04/20/13 0400 04/20/13 1405 04/21/13 0500  NA 145 150* 152*  K 3.4* 3.8 3.9  CL 108 113* 116*  CO2 30 30 30   BUN 45* 48* 52*  CREATININE 1.05 1.09 1.04  GLUCOSE 128* 147* 146*   Electrolytes  Recent Labs Lab 04/18/13 1448  04/20/13 0400 04/20/13 1405 04/21/13 0500  CALCIUM 8.4  < > 9.0 8.8 8.8  MG 2.2  --   --  2.0  --   < > = values in this interval not displayed. Sepsis Markers  Recent Labs Lab 2013-04-19 1235 April 19, 2013 2300 04/20/13 0400 04/21/13 0500  LATICACIDVEN 7.0* 5.0*  --   --   PROCALCITON  --   --  4.44 2.75   ABG  Recent Labs Lab  04/17/13 1057 04/18/13 0358 04/19/13 0342  PHART 7.343* 7.329* 7.360  PCO2ART 37.9 40.4 46.4*  PO2ART 50.9* 61.3* 71.3*   Liver Enzymes  Recent Labs Lab 04/18/2013 1235 04/19/13 0427 04/21/13 0500  AST 28 21 24   ALT 19 13 14   ALKPHOS 68 94 111  BILITOT 1.3* 1.2 3.0*  ALBUMIN 3.7 1.8* 1.6*   Cardiac Enzymes  Recent Labs Lab 04/28/2013 1235  TROPONINI <0.30  PROBNP 448.0   Glucose  Recent Labs Lab 04/20/13 1138 04/20/13 1509 04/20/13 1940 04/21/13 0008 04/21/13 0325 04/21/13 0805  GLUCAP 146* 129* 137* 123* 135* 126*     Imaging  CXR: 12/19 CXR R>L airspace disease, improved aeration  EKG: NSR w/ occ PAC  ASSESSMENT / PLAN:  PULMONARY A: 1) Acute hypoxemic respiratory failure due to aspiration pneumonitis/ ARDS CXR a little better  but. Still w/ High FIO2/PEEP requirements. BAL 12/18 w/ copious secretions. ESR 102/ PCT 4.4-->2.75, wonder if all this is a response to widened abx P:   -Full vent support -Cont TVol @ 6cc/kg -See ID section - if FIO2 requirements remain high thru weekend will get CT chest on Monday  -target PaO2 55-65 -ABG/CXR/WUA/SBT 12/19 am  -hold lasix 12/19  CARDIOVASCULAR A:  1) Septic shock > resolved 2) New onset afib with RVR due to sepsis and levophed, failed cardioversion; converted with amiodarone, Now NSR w/ occ PAC, had Run of SVT 12/19 P:  PRN lopressor KVO IVFs Think we can avoid full dose anticoagulation for now, but if goes back into AF will need to consider  RENAL A:   Acute renal failure> resolved Hypernatremia s/p diuresis  Hypokalemia  P:   Replace free water F/u chem Hold diuresis 12/19  GASTROINTESTINAL A:   1) R femoral hernia and SBO, viable bowel; Status post surgical repair P:   - GI prophylaxis with protonix - slow advance tubefeeds   HEMATOLOGIC A:   mild thrombocytopenia (dropped 50%) Plts holding in the 70-80 range. Changed to LMWH on 12/16.  Mild elevation in WBC  P:  - Will follow CBC, monitor for bleeding  - f/u HIT panel, if neg consider alternative etiology: medication vs possible occult infection   INFECTIOUS A:   1) Aspiration PNA + Klebsiella PNA (Pan sens)  fever curve a little better, Copious thick secretions noted on FOB 12/18. w/ no sig change in O2 requirements 12/19, PCT has fallen since adding back vanc  P:   - see above, f/u culture data narrow when able  - trend PCT   ENDOCRINE A:   1) No issues    NEUROLOGIC A:   1) Intubated, sedated P:   - Sedation and pain management with fentanyl - RASS score goal of -1  Global: LIMITED CODE BLUE (No CPR or shocks, all else OK) Discussed with wife at length on 12/19> he doesn't want CPR, but since we feel that there is still hope he will improve, he would want [place] to continue with  aggressive care otherwise.  I advised her this will be a long course.   BABCOCK,PETE  Attending:  I have seen and examined the patient with nurse practitioner/resident and agree with and have edited the note above.   I have personally obtained a history, examined the patient, evaluated laboratory and imaging results, formulated the assessment and plan and placed orders. CRITICAL CARE: Critical Care Time devoted to patient care services described in this note is 35 minutes.   Yolonda Kida PCCM Pager: [phone] Cell: [phone]  If no response, call [phone]

## 2013-04-21 NOTE — Progress Notes (Signed)
eLink Physician-Brief Progress Note Patient Name: Casey Davila DOB: [birth year] MRN: [id number]  Date of Service  04/21/2013   HPI/Events of Note   Allergy to vocodin n/v, likely from narc fever  eICU Interventions  We can continue with tylenal for fever dont want to use ibuprofen      Nelda Bucks. 04/21/2013, 5:54 PM

## 2013-04-22 ENCOUNTER — Inpatient Hospital Stay (HOSPITAL_COMMUNITY): Payer: Medicare Other

## 2013-04-22 LAB — GLUCOSE, CAPILLARY
Glucose-Capillary: 131 mg/dL — ABNORMAL HIGH (ref 70–99)
Glucose-Capillary: 126 mg/dL — ABNORMAL HIGH (ref 70–99)

## 2013-04-22 LAB — COMPREHENSIVE METABOLIC PANEL
ALT: 18 U/L (ref 0–53)
AST: 34 U/L (ref 0–37)
Alkaline Phosphatase: 115 U/L (ref 39–117)
Calcium: 9 mg/dL (ref 8.4–10.5)
Chloride: 112 mEq/L (ref 96–112)
GFR calc Af Amer: 73 mL/min — ABNORMAL LOW (ref >90)
Glucose, Bld: 130 mg/dL — ABNORMAL HIGH (ref 70–99)
Potassium: 4 mEq/L (ref 3.5–5.1)
Sodium: 148 mEq/L — ABNORMAL HIGH (ref 135–145)
Total Protein: 4.9 g/dL — ABNORMAL LOW (ref 6.0–8.3)

## 2013-04-22 LAB — PROCALCITONIN: Procalcitonin: 1.9 ng/mL

## 2013-04-22 LAB — CULTURE, BLOOD (ROUTINE X 2): Culture: NO GROWTH

## 2013-04-22 LAB — CBC
Hemoglobin: 9.5 g/dL — ABNORMAL LOW (ref 13.0–17.0)
MCH: 27 pg (ref 26.0–34.0)
MCHC: 32.3 g/dL (ref 30.0–36.0)
Platelets: 85 10*3/uL — ABNORMAL LOW (ref 150–400)
RBC: 3.52 MIL/uL — ABNORMAL LOW (ref 4.22–5.81)
WBC: 15.7 10*3/uL — ABNORMAL HIGH (ref 4.0–10.5)

## 2013-04-22 LAB — VANCOMYCIN, TROUGH: Vancomycin Tr: 13.9 ug/mL (ref 10.0–20.0)

## 2013-04-22 MED ORDER — METOCLOPRAMIDE HCL 5 MG PO TABS
5.0000 mg | ORAL_TABLET | Freq: Four times a day (QID) | ORAL | Status: DC
  Administered 2013-04-22 (×4): 5 mg via ORAL
  Filled 2013-04-22 (×9): qty 1

## 2013-04-22 MED ORDER — VITAL AF 1.2 CAL PO LIQD
1000.0000 mL | ORAL | Status: DC
  Administered 2013-04-22 – 2013-04-23 (×2): 1000 mL
  Filled 2013-04-22 (×4): qty 1000

## 2013-04-22 MED ORDER — BISACODYL 10 MG RE SUPP
10.0000 mg | Freq: Every day | RECTAL | Status: DC
  Administered 2013-04-22 – 2013-04-27 (×5): 10 mg via RECTAL
  Filled 2013-04-22 (×5): qty 1

## 2013-04-22 NOTE — Progress Notes (Signed)
Brief Pharmacy Note: Vancomycin  Vanc trough 13.9 on 750mg  q12, Vanc resumed 12/18 (received 12/13-12/15)  Patient 87 yr, Cl ~ 50 ml/min  Will continue same dose, continue to monitor cultures, PCT  Thank you,  Otho Bellows PharmD Pager [phone] 04/22/2013, 9:59 PM

## 2013-04-22 NOTE — Progress Notes (Signed)
Name: Casey Davila MRN: [id number] DOB: [birth year]    ADMISSION DATE:  04/04/2013 CONSULTATION DATE:  04/24/2013  REFERRING MD :  Romie Levee, MD PRIMARY SERVICE: Surgery  CHIEF COMPLAINT:   Post repair of incarcerated femoral hernia. Out of the OR intubated.   BRIEF PATIENT DESCRIPTION:  79 years old male with PMH relevant for HTN.  Underwent emergent repair and now out of the OR intubated in the ICU. CCM consult called to assist with ventilator management.   SIGNIFICANT EVENTS / STUDIES:  12/14 CT angio chest/ab/pelv> esophagus full of fluid? (thick), nearly occludes left mainstem with external compression; left lower lung pulm nodule; aspiration pneumonitis bilateral base, SBO, R femoral hernia 12/14 afib with rvr/shock, attempted cardioversion x3 unsuccessful 12/18 FOB, copious thick secretions  LINES / TUBES: 12/14 L IJ CVL >> 12/13 ETT >>  CULTURES: 12/14 resp: pan sens Klebsiella PNA  12/14 blood >> 12/18 BAL>>>  ANTIBIOTICS: 12/13 vanc >>12/15 12/13 zosyn >>12/18 12/18 primaxin>>> 12/18 vanc>>>  SUBJECTIVE:   04/22/13 - 70% fio2/10 peep. Agitated on WUA.  Not making progress. Stil febrile  VITAL SIGNS: Temp:  [99.4 F (37.4 C)-101.2 F (38.4 C)] 99.4 F (37.4 C) (12/20 0800) Pulse Rate:  [101-111] 106 (12/20 0500) Resp:  [17-24] 23 (12/20 0500) BP: (126-154)/(48-64) 147/62 mmHg (12/20 0500) SpO2:  [91 %-97 %] 93 % (12/20 0753) FiO2 (%):  [50 %-70 %] 70 % (12/20 1002) Weight:  [77.8 kg (171 lb 8.3 oz)] 77.8 kg (171 lb 8.3 oz) (12/20 0400) HEMODYNAMICS: CVP:  [8 mmHg-21 mmHg] 12 mmHg VENTILATOR SETTINGS: Vent Mode:  [-] PRVC FiO2 (%):  [50 %-70 %] 70 % Set Rate:  [18 bmp] 18 bmp Vt Set:  [450 mL] 450 mL PEEP:  [10 cmH20] 10 cmH20 Plateau Pressure:  [17 cmH20-22 cmH20] 20 cmH20 INTAKE / OUTPUT: Intake/Output     12/19 0701 - 12/20 0700 12/20 0701 - 12/21 0700   I.V. (mL/kg) 508.7 (6.5)    NG/GT 1200    IV Piggyback 600    Total Intake(mL/kg)  2308.7 (29.7)    Urine (mL/kg/hr) 1930 (1)    Total Output 1930     Net +378.7            PHYSICAL EXAMINATION: General: comfortable on vent HEENT: NCAT, ETT in place PULM: Decreased t/o CV: RRR, no mgr, NSR on tele  AB: BS noted, non-tender Ext: warm, no edema, SCD's Neuro: arouses easily, follows commands  LABS: PULMONARY  Recent Labs Lab 04/16/13 0914 04/17/13 0418 04/17/13 1057 04/18/13 0358 04/19/13 0342  PHART 7.392 7.359 7.343* 7.329* 7.360  PCO2ART 35.6 34.5* 37.9 40.4 46.4*  PO2ART 57.2* 69.5* 50.9* 61.3* 71.3*  HCO3 21.1 18.9* 20.1 20.7 25.4*  TCO2 19.8 17.7 18.8 19.3 23.4  O2SAT 87.0 92.1 81.8 89.1 92.9    CBC  Recent Labs Lab 04/20/13 0400 04/21/13 0500 04/22/13 0330  HGB 10.3* 9.8* 9.5*  HCT 31.7* 30.8* 29.4*  WBC 17.2* 14.5* 15.7*  PLT 72* 71* 85*    COAGULATION No results found for this basename: INR,  in the last 168 hours  CARDIAC   Recent Labs Lab 04/11/2013 1235  TROPONINI <0.30    Recent Labs Lab 05/01/2013 1235  PROBNP 448.0     CHEMISTRY  Recent Labs Lab 04/18/13 0545 04/18/13 1448 04/19/13 0427 04/20/13 0400 04/20/13 1405 04/21/13 0500 04/22/13 0330  NA 137 140 143 145 150* 152* 148*  K 3.6 3.9 3.2* 3.4* 3.8 3.9 4.0  CL 106 108 108 108  113* 116* 112  CO2 21 23 26 30 30 30 30   GLUCOSE 97 86 95 128* 147* 146* 130*  BUN 39* 38* 41* 45* 48* 52* 51*  CREATININE 0.87 0.94 1.06 1.05 1.09 1.04 1.05  CALCIUM 8.2* 8.4 8.6 9.0 8.8 8.8 9.0  MG  --  2.2  --   --  2.0  --   --    Estimated Creatinine Clearance: 54.8 ml/min (by C-G formula based on Cr of 1.05).   LIVER  Recent Labs Lab 04/23/2013 1235 04/19/13 0427 04/21/13 0500 04/22/13 0330  AST 28 21 24  34  ALT 19 13 14 18   ALKPHOS 68 94 111 115  BILITOT 1.3* 1.2 3.0* 2.9*  PROT 7.2 5.3* 5.1* 4.9*  ALBUMIN 3.7 1.8* 1.6* 1.4*     INFECTIOUS  Recent Labs Lab 04/05/2013 1235 04/07/2013 2300 04/20/13 0400 04/21/13 0500 04/22/13 0330  LATICACIDVEN 7.0* 5.0*   --   --   --   PROCALCITON  --   --  4.44 2.75 1.90     ENDOCRINE CBG (last 3)   Recent Labs  04/22/13 0021 04/22/13 0423 04/22/13 0753  GLUCAP 139* 108* 131*         IMAGING x48h  Dg Chest Port 1 View  04/22/2013   CLINICAL DATA:  Evaluate endotracheal tube.  EXAM: PORTABLE CHEST - 1 VIEW  COMPARISON:  04/21/2013.  FINDINGS: Cardiopericardial silhouette is within normal limits. Retrocardiac density is present likely representing atelectasis and airspace disease in combination. Enteric tube is present in the esophagus with the tip not visualized left IJ central line remains present with the tip in the upper SVC. Asymmetric right-greater-than-left airspace disease is present. The right basilar airspace disease has increased compared to yesterday's exam. Given the interval, this is most compatible with increasing asymmetric pulmonary edema. Superimposed pneumonia may be present.  IMPRESSION: 1. Stable support apparatus. 2. Increasing right chest airspace disease likely represents pulmonary edema given knee increased since yesterday's study. Underlying ARDS or pneumonia may also be present.   Electronically Signed   By: Andreas Newport M.D.   On: 04/22/2013 07:36   Dg Chest Port 1 View  04/21/2013   CLINICAL DATA:  Intubation.  EXAM: PORTABLE CHEST - 1 VIEW  COMPARISON:  04/20/2013.  FINDINGS: Endotracheal tube, left IJ line, NG tube are in position. Persistent unchanged bilateral pulmonary infiltrates noted. These findings are most consistent with bilateral pneumonia. Pulmonary edema cannot be excluded. There is no significant cardiomegaly or pulmonary venous congestion. No pneumothorax. No acute osseous abnormality  IMPRESSION: 1. Stable line and tube positions. 2. Persistent unchanged bilateral pulmonary infiltrates, most likely related to pneumonia.   Electronically Signed   By: Maisie Fus  Register   On: 04/21/2013 07:33       ASSESSMENT / PLAN:  PULMONARY A: 1) Acute hypoxemic  respiratory failure due to aspiration pneumonitis/ ARDS  04/22/13  - Persistent ARDS. Net 3L positive past 7 days  P:   -Full vent support -Cont TVol @ 6cc/kg -See ID section - if FIO2 requirements remain high thru weekend will get CT chest on Monday 04/24/13 -target PaO2 55-65 -ABG/CXR/WUA/SBT 12/19 am    CARDIOVASCULAR A:  1) Septic shock > resolved 2) New onset afib with RVR due to sepsis and levophed, failed cardioversion; converted with amiodarone, Now NSR w/ occ PAC, had Run of SVT 12/19 04/22/13 - sinus tach  P:  PRN lopressor KVO IVFs Think we can avoid full dose anticoagulation for now, but if goes back  into AF will need to consider  RENAL A:   Acute renal failure> resolved Hypernatremia s/p diuresis , mild  P:   F/u chem Hold diuresis 12/19 Check bnp  GASTROINTESTINAL A:   1) R femoral hernia and SBO, viable bowel; Status post surgical repair P:   - GI prophylaxis with protonix - slow advance tubefeeds   HEMATOLOGIC A:   ANemia of critical illness Mild thrombocytopenia (dropped 50%) due to illness: Plts holding in the 70-80 range. Changed to LMWH on 12/16.  HTT negative 12/16//14   P:  - PRBC for hgb </= 6.9gm%    - exceptions are   -  if ACS susepcted/confirmed then transfuse for hgb </= 8.0gm%,  or    - active bleeding with hemodynamic instability, then transfuse regardless of hemoglobin value   At at all times try to transfuse 1 unit prbc as possible with exception of active hemorrhage    INFECTIOUS A:   1) Aspiration PNA + Klebsiella PNA (Pan sens)  fever curve a little better, Copious thick secretions noted on FOB 12/18. w/ no sig change in O2 requirements 12/19, PCT has fallen since adding back vanc   04/22/13 - persistent low grade fever P:   - see above, f/u culture data narrow when able  - trend PCT  - depending on course, might have to add anti fungal  ENDOCRINE A:   1) No issues    NEUROLOGIC A:   1) Intubated, sedated P:    - Sedation and pain management with fentanyl - RASS score goal of -1  Global:   Discussed with wife at length on 12/19> he doesn't want CPR, but since we feel that there is still hope he will improve, he would want [place] to continue with aggressive care otherwise.  I advised her this will be a long course.LIMITED CODE BLUE (No CPR or shocks, all else OK)   04/22/13 - wife can be  updated when she returns to  bedside   The patient is critically ill with multiple organ systems failure and requires high complexity decision making for assessment and support, frequent evaluation and titration of therapies, application of advanced monitoring technologies and extensive interpretation of multiple databases.   Critical Care Time devoted to patient care services described in this note is  45  Minutes.  Dr. Kalman Shan, M.D., [facility].C.P Pulmonary and Critical Care Medicine Staff Physician [facility] Pager: [phone], If no answer or between  15:00h - 7:00h: call [phone]  04/22/2013 10:47 AM

## 2013-04-22 NOTE — Progress Notes (Signed)
Casey Davila [id number] 1925-10-06  CARE TEAM:  PCP: Casey Meo, MD  Outpatient Care Team: Patient Care Team: Casey Meo, MD as PCP - General (Internal Medicine)  Inpatient Treatment Team: Treatment Team: Attending Provider: Lupita Leash, MD; Rounding Team: Md Pccm, MD; Dietitian: Casey Davila, RD; Consulting Physician: Casey Limbo, MD; Registered Nurse: Casey Manns, RN   Subjective:  No major events ICU RN Casey Davila at bedside No major BM yet   Objective:  Vital signs:  Filed Vitals:   04/22/13 0300 04/22/13 0317 04/22/13 0400 04/22/13 0500  BP: 143/62 143/62 148/63 147/62  Pulse: 105 106 104 106  Temp:   100.1 F (37.8 C)   TempSrc:   Oral   Resp: 21 19 20 23   Height:      Weight:   171 lb 8.3 oz (77.8 kg)   SpO2: 93% 93% 94% 94%       Intake/Output   Yesterday:  12/19 0701 - 12/20 0700 In: 2308.7 [I.V.:508.7; NG/GT:1200; IV Piggyback:600] Out: 1930 [Urine:1930] This shift:     Bowel function:  Flatus: Y?  BM: n  Drain: Residuals TFs 400, 200, 150, ...  Physical Exam:  General: Pt intubated/sedated  Eyes: PERRL rxtive. Sclera clear.  No icterus Neuro: Moves all 4 ext w/o focal sensory/motor deficits. Lymph: No head/neck/groin lymphadenopathy Psych:  No delerium/psychosis/paranoia HENT: Normocephalic, Mucus membranes moist.  No thrush Neck: Supple, No tracheal deviation Chest: Coarse BS w rhonchi bilat.  Good excursion CV:  Pulses intact.  Regular rhythm MS: Normal AROM mjr joints.  No obvious deformity Abdomen: Soft.  Nondistended.  No evidence of peritonitis.  No incarcerated hernias. GU:  NEMG.  R groin incision clean Ext:  SCDs BLE.  2+ anasarca.   No cyanosis Skin: No petechiae / purpura   Problem List:   Principal Problem:   Incarcerated femoral hernia Active Problems:   Acute respiratory failure   Sepsis(995.91)   AKI (acute kidney injury)   Hypokalemia   Thrombocytopathia   A-fib   Protein-calorie malnutrition    ARDS (adult respiratory distress syndrome)   Assessment  Casey Davila  79 y.o. male  7 Days Post-Op  Procedure(s): OPEN REPAIR OF FEMORAL HERNIA  Fair but stable  Plan:  -vol overload - +5L still w anasarca prob related to deconditioning/prot cal malnut.  needs diuresis but challenge w Afib/ST - consider restarting Lasix as tolerated -stop free water GT boluses - he doesn't need more volume & makes risk of aspiration higher -OK to adv TFs to goal but follow residuals closely.  Add reglan to help gastric emptying -try supp daily -d/c staples -IV ABx for aspiration PNA -Afib/ST - per CCM/Cards ? Restart Bblocker/amio if persistent inc HR? -VTE prophylaxis- SCDs, etc -mobilize as tolerated to help recovery  Casey Davila, M.D., F.A.C.S. Gastrointestinal and Minimally Invasive Surgery [facility] [address] [place], [place] [address] [phone] Main / Paging   04/22/2013   Results:   Labs: Results for orders placed during the hospital encounter of 04/09/2013 (from the past 48 hour(s))  CULTURE, BAL-QUANTITATIVE     Status: None   Collection Time    04/20/13  9:47 AM      Result Value Range   Specimen Description BRONCHIAL ALVEOLAR LAVAGE     Special Requests NONE     Gram Stain       Value: MODERATE WBC PRESENT, PREDOMINANTLY PMN     NO SQUAMOUS EPITHELIAL CELLS  SEEN     NO ORGANISMS SEEN     Performed at [facility] Count PENDING     Culture       Value: Culture reincubated for better growth     Performed at [facility]   Report Status PENDING    VIRAL CULTURE VIRC     Status: None   Collection Time    04/20/13  9:47 AM      Result Value Range   Specimen Description BRONCHIAL ALVEOLAR LAVAGE     Special Requests NONE     Culture       Value: Culture has been initiated.     Note:    The current Enterovirus culture system does not detect Enterovirus D68. If Enterovirus D68 is suspected,  please call client services to add the test for Enterovirus PCR (Test code 16109).     Performed at [facility]   Report Status PENDING    GLUCOSE, CAPILLARY     Status: Abnormal   Collection Time    04/20/13 11:38 AM      Result Value Range   Glucose-Capillary 146 (*) 70 - 99 mg/dL   Comment 1 Documented in Chart     Comment 2 Notify RN    BASIC METABOLIC PANEL     Status: Abnormal   Collection Time    04/20/13  2:05 PM      Result Value Range   Sodium 150 (*) 135 - 145 mEq/L   Potassium 3.8  3.5 - 5.1 mEq/L   Chloride 113 (*) 96 - 112 mEq/L   CO2 30  19 - 32 mEq/L   Glucose, Bld 147 (*) 70 - 99 mg/dL   BUN 48 (*) 6 - 23 mg/dL   Creatinine, Ser 6.04  0.50 - 1.35 mg/dL   Calcium 8.8  8.4 - 54.0 mg/dL   GFR calc non Af Amer 60 (*) >90 mL/min   GFR calc Af Amer 69 (*) >90 mL/min   Comment: (NOTE)     The eGFR has been calculated using the CKD EPI equation.     This calculation has not been validated in all clinical situations.     eGFR's persistently <90 mL/min signify possible Chronic Kidney     Disease.  MAGNESIUM     Status: None   Collection Time    04/20/13  2:05 PM      Result Value Range   Magnesium 2.0  1.5 - 2.5 mg/dL  GLUCOSE, CAPILLARY     Status: Abnormal   Collection Time    04/20/13  3:09 PM      Result Value Range   Glucose-Capillary 129 (*) 70 - 99 mg/dL   Comment 1 Documented in Chart     Comment 2 Notify RN    GLUCOSE, CAPILLARY     Status: Abnormal   Collection Time    04/20/13  7:40 PM      Result Value Range   Glucose-Capillary 137 (*) 70 - 99 mg/dL   Comment 1 Notify RN    GLUCOSE, CAPILLARY     Status: Abnormal   Collection Time    04/21/13 12:08 AM      Result Value Range   Glucose-Capillary 123 (*) 70 - 99 mg/dL  GLUCOSE, CAPILLARY     Status: Abnormal   Collection Time    04/21/13  3:25 AM      Result Value Range   Glucose-Capillary 135 (*) 70 -  99 mg/dL  PROCALCITONIN     Status: None   Collection Time    04/21/13  5:00 AM       Result Value Range   Procalcitonin 2.75     Comment:            Interpretation:     PCT > 2 ng/mL:     Systemic infection (sepsis) is likely,     unless other causes are known.     (NOTE)             ICU PCT Algorithm               Non ICU PCT Algorithm        ----------------------------     ------------------------------             PCT < 0.25 ng/mL                 PCT < 0.1 ng/mL         Stopping of antibiotics            Stopping of antibiotics           strongly encouraged.               strongly encouraged.        ----------------------------     ------------------------------           PCT level decrease by               PCT < 0.25 ng/mL           >= 80% from peak PCT           OR PCT 0.25 - 0.5 ng/mL          Stopping of antibiotics                                                 encouraged.         Stopping of antibiotics               encouraged.        ----------------------------     ------------------------------           PCT level decrease by              PCT >= 0.25 ng/mL           < 80% from peak PCT            AND PCT >= 0.5 ng/mL            Continuing antibiotics                                                  encouraged.           Continuing antibiotics                encouraged.        ----------------------------     ------------------------------         PCT level increase compared          PCT > 0.5 ng/mL             with peak PCT AND  PCT >= 0.5 ng/mL             Escalation of antibiotics                                              strongly encouraged.          Escalation of antibiotics            strongly encouraged.  CBC     Status: Abnormal   Collection Time    04/21/13  5:00 AM      Result Value Range   WBC 14.5 (*) 4.0 - 10.5 K/uL   RBC 3.70 (*) 4.22 - 5.81 MIL/uL   Hemoglobin 9.8 (*) 13.0 - 17.0 g/dL   HCT 40.9 (*) 81.1 - 91.4 %   MCV 83.2  78.0 - 100.0 fL   MCH 26.5  26.0 - 34.0 pg   MCHC 31.8  30.0 - 36.0 g/dL   RDW  78.2 (*) 95.6 - 15.5 %   Platelets 71 (*) 150 - 400 K/uL   Comment: CONSISTENT WITH PREVIOUS RESULT  COMPREHENSIVE METABOLIC PANEL     Status: Abnormal   Collection Time    04/21/13  5:00 AM      Result Value Range   Sodium 152 (*) 135 - 145 mEq/L   Potassium 3.9  3.5 - 5.1 mEq/L   Chloride 116 (*) 96 - 112 mEq/L   CO2 30  19 - 32 mEq/L   Glucose, Bld 146 (*) 70 - 99 mg/dL   BUN 52 (*) 6 - 23 mg/dL   Creatinine, Ser 2.13  0.50 - 1.35 mg/dL   Calcium 8.8  8.4 - 08.6 mg/dL   Total Protein 5.1 (*) 6.0 - 8.3 g/dL   Albumin 1.6 (*) 3.5 - 5.2 g/dL   AST 24  0 - 37 U/L   ALT 14  0 - 53 U/L   Alkaline Phosphatase 111  39 - 117 U/L   Total Bilirubin 3.0 (*) 0.3 - 1.2 mg/dL   GFR calc non Af Amer 63 (*) >90 mL/min   GFR calc Af Amer 73 (*) >90 mL/min   Comment: (NOTE)     The eGFR has been calculated using the CKD EPI equation.     This calculation has not been validated in all clinical situations.     eGFR's persistently <90 mL/min signify possible Chronic Kidney     Disease.  GLUCOSE, CAPILLARY     Status: Abnormal   Collection Time    04/21/13  8:05 AM      Result Value Range   Glucose-Capillary 126 (*) 70 - 99 mg/dL  GLUCOSE, CAPILLARY     Status: Abnormal   Collection Time    04/21/13 12:02 PM      Result Value Range   Glucose-Capillary 135 (*) 70 - 99 mg/dL  GLUCOSE, CAPILLARY     Status: Abnormal   Collection Time    04/21/13  4:51 PM      Result Value Range   Glucose-Capillary 119 (*) 70 - 99 mg/dL  GLUCOSE, CAPILLARY     Status: Abnormal   Collection Time    04/21/13  8:39 PM      Result Value Range   Glucose-Capillary 126 (*) 70 - 99 mg/dL  PROCALCITONIN     Status: None   Collection Time    04/22/13  3:30 AM      Result Value Range   Procalcitonin 1.90     Comment:            Interpretation:     PCT > 0.5 ng/mL and <= 2 ng/mL:     Systemic infection (sepsis) is possible,     but other conditions are known to elevate     PCT as well.     (NOTE)              ICU PCT Algorithm               Non ICU PCT Algorithm        ----------------------------     ------------------------------             PCT < 0.25 ng/mL                 PCT < 0.1 ng/mL         Stopping of antibiotics            Stopping of antibiotics           strongly encouraged.               strongly encouraged.        ----------------------------     ------------------------------           PCT level decrease by               PCT < 0.25 ng/mL           >= 80% from peak PCT           OR PCT 0.25 - 0.5 ng/mL          Stopping of antibiotics                                                 encouraged.         Stopping of antibiotics               encouraged.        ----------------------------     ------------------------------           PCT level decrease by              PCT >= 0.25 ng/mL           < 80% from peak PCT            AND PCT >= 0.5 ng/mL            Continuing antibiotics                                                  encouraged.           Continuing antibiotics                encouraged.        ----------------------------     ------------------------------         PCT level increase compared          PCT > 0.5 ng/mL             with peak PCT AND  PCT >= 0.5 ng/mL             Escalation of antibiotics                                              strongly encouraged.          Escalation of antibiotics            strongly encouraged.  CBC     Status: Abnormal   Collection Time    04/22/13  3:30 AM      Result Value Range   WBC 15.7 (*) 4.0 - 10.5 K/uL   RBC 3.52 (*) 4.22 - 5.81 MIL/uL   Hemoglobin 9.5 (*) 13.0 - 17.0 g/dL   HCT 16.1 (*) 09.6 - 04.5 %   MCV 83.5  78.0 - 100.0 fL   MCH 27.0  26.0 - 34.0 pg   MCHC 32.3  30.0 - 36.0 g/dL   RDW 40.9 (*) 81.1 - 91.4 %   Platelets 85 (*) 150 - 400 K/uL   Comment: REPEATED TO VERIFY     SPECIMEN CHECKED FOR CLOTS     PLATELET COUNT CONFIRMED BY SMEAR  COMPREHENSIVE METABOLIC PANEL     Status: Abnormal    Collection Time    04/22/13  3:30 AM      Result Value Range   Sodium 148 (*) 135 - 145 mEq/L   Potassium 4.0  3.5 - 5.1 mEq/L   Chloride 112  96 - 112 mEq/L   CO2 30  19 - 32 mEq/L   Glucose, Bld 130 (*) 70 - 99 mg/dL   BUN 51 (*) 6 - 23 mg/dL   Creatinine, Ser 7.82  0.50 - 1.35 mg/dL   Calcium 9.0  8.4 - 95.6 mg/dL   Total Protein 4.9 (*) 6.0 - 8.3 g/dL   Albumin 1.4 (*) 3.5 - 5.2 g/dL   AST 34  0 - 37 U/L   ALT 18  0 - 53 U/L   Alkaline Phosphatase 115  39 - 117 U/L   Total Bilirubin 2.9 (*) 0.3 - 1.2 mg/dL   GFR calc non Af Amer 63 (*) >90 mL/min   GFR calc Af Amer 73 (*) >90 mL/min   Comment: (NOTE)     The eGFR has been calculated using the CKD EPI equation.     This calculation has not been validated in all clinical situations.     eGFR's persistently <90 mL/min signify possible Chronic Kidney     Disease.    Imaging / Studies: Dg Chest Port 1 View  04/21/2013   CLINICAL DATA:  Intubation.  EXAM: PORTABLE CHEST - 1 VIEW  COMPARISON:  04/20/2013.  FINDINGS: Endotracheal tube, left IJ line, NG tube are in position. Persistent unchanged bilateral pulmonary infiltrates noted. These findings are most consistent with bilateral pneumonia. Pulmonary edema cannot be excluded. There is no significant cardiomegaly or pulmonary venous congestion. No pneumothorax. No acute osseous abnormality  IMPRESSION: 1. Stable line and tube positions. 2. Persistent unchanged bilateral pulmonary infiltrates, most likely related to pneumonia.   Electronically Signed   By: Maisie Fus  Register   On: 04/21/2013 07:33    Medications / Allergies: per chart  Antibiotics: Anti-infectives   Start     Dose/Rate Route Frequency Ordered Stop   04/20/13 2200  vancomycin (VANCOCIN) IVPB 750 mg/150 ml premix  750 mg 150 mL/hr over 60 Minutes Intravenous Every 12 hours 04/20/13 0903     04/20/13 1200  imipenem-cilastatin (PRIMAXIN) 500 mg in sodium chloride 0.9 % 100 mL IVPB     500 mg 200 mL/hr over 30  Minutes Intravenous Every 8 hours 04/20/13 1009     04/20/13 0930  vancomycin (VANCOCIN) IVPB 1000 mg/200 mL premix     1,000 mg 200 mL/hr over 60 Minutes Intravenous  Once 04/20/13 0903 04/20/13 1100   04/16/13 0600  vancomycin (VANCOCIN) IVPB 750 mg/150 ml premix  Status:  Discontinued     750 mg 150 mL/hr over 60 Minutes Intravenous Every 12 hours 04/14/2013 1400 04/17/13 0858   04/20/2013 2200  piperacillin-tazobactam (ZOSYN) IVPB 3.375 g  Status:  Discontinued     3.375 g 12.5 mL/hr over 240 Minutes Intravenous 3 times per day 04/27/2013 1400 04/20/13 0947   04/13/2013 1530  vancomycin (VANCOCIN) 1,406 mg in sodium chloride 0.9 % 500 mL IVPB  Status:  Discontinued     20 mg/kg  70.3 kg 250 mL/hr over 120 Minutes Intravenous  Once 04/07/2013 1525 04/11/2013 1525   04/12/2013 1530  piperacillin-tazobactam (ZOSYN) IVPB 3.375 g  Status:  Discontinued     3.375 g 100 mL/hr over 30 Minutes Intravenous  Once 04/11/2013 1525 04/24/2013 1525   05/03/2013 1430  [MAR Hold]  vancomycin (VANCOCIN) IVPB 1000 mg/200 mL premix     (On MAR Hold since 04/10/2013 1630)   1,000 mg 200 mL/hr over 60 Minutes Intravenous STAT 04/26/2013 1358 04/21/2013 1632   04/19/2013 1430  piperacillin-tazobactam (ZOSYN) IVPB 3.375 g     3.375 g 100 mL/hr over 30 Minutes Intravenous STAT 04/14/2013 1358 04/29/2013 1514      Note: This dictation was prepared with Dragon/digital dictation along with [employer]. Any transcriptional errors that result from this process are unintentional.

## 2013-04-23 ENCOUNTER — Inpatient Hospital Stay (HOSPITAL_COMMUNITY): Payer: Medicare Other

## 2013-04-23 ENCOUNTER — Encounter (HOSPITAL_COMMUNITY): Payer: Self-pay | Admitting: Radiology

## 2013-04-23 DIAGNOSIS — E870 Hyperosmolality and hypernatremia: Secondary | ICD-10-CM | POA: Diagnosis present

## 2013-04-23 LAB — BLOOD GAS, ARTERIAL
Acid-Base Excess: 4.2 mmol/L — ABNORMAL HIGH (ref 0.0–2.0)
Bicarbonate: 31.3 mEq/L — ABNORMAL HIGH (ref 20.0–24.0)
Drawn by: 317871
FIO2: 0.5 %
MECHVT: 0.45 mL
O2 Saturation: 95.2 %
PEEP: 10 cmH2O
Patient temperature: 101.5
RATE: 18 resp/min
pH, Arterial: 7.277 — ABNORMAL LOW (ref 7.350–7.450)
pO2, Arterial: 88.4 mmHg (ref 80.0–100.0)

## 2013-04-23 LAB — CBC WITH DIFFERENTIAL/PLATELET
Basophils Relative: 0 % (ref 0–1)
HCT: 26.7 % — ABNORMAL LOW (ref 39.0–52.0)
Hemoglobin: 8.5 g/dL — ABNORMAL LOW (ref 13.0–17.0)
Lymphocytes Relative: 8 % — ABNORMAL LOW (ref 12–46)
MCH: 27.2 pg (ref 26.0–34.0)
MCHC: 31.8 g/dL (ref 30.0–36.0)
MCV: 85.6 fL (ref 78.0–100.0)
Monocytes Absolute: 0.7 10*3/uL (ref 0.1–1.0)
Monocytes Relative: 5 % (ref 3–12)
Neutro Abs: 12 10*3/uL — ABNORMAL HIGH (ref 1.7–7.7)
RBC: 3.12 MIL/uL — ABNORMAL LOW (ref 4.22–5.81)
RDW: 17.4 % — ABNORMAL HIGH (ref 11.5–15.5)
WBC: 13.8 10*3/uL — ABNORMAL HIGH (ref 4.0–10.5)
nRBC: 1 /100 WBC — ABNORMAL HIGH

## 2013-04-23 LAB — CULTURE, BAL-QUANTITATIVE W GRAM STAIN: Colony Count: 5000

## 2013-04-23 LAB — RESPIRATORY VIRUS PANEL
Adenovirus: NOT DETECTED
Influenza A H1: NOT DETECTED
Influenza A H3: NOT DETECTED
Influenza B: NOT DETECTED
Metapneumovirus: NOT DETECTED
Parainfluenza 1: NOT DETECTED
Parainfluenza 3: NOT DETECTED
Respiratory Syncytial Virus A: NOT DETECTED
Respiratory Syncytial Virus B: NOT DETECTED

## 2013-04-23 LAB — GLUCOSE, CAPILLARY
Glucose-Capillary: 136 mg/dL — ABNORMAL HIGH (ref 70–99)
Glucose-Capillary: 153 mg/dL — ABNORMAL HIGH (ref 70–99)

## 2013-04-23 LAB — BASIC METABOLIC PANEL
BUN: 58 mg/dL — ABNORMAL HIGH (ref 6–23)
CO2: 32 mEq/L (ref 19–32)
Chloride: 118 mEq/L — ABNORMAL HIGH (ref 96–112)
GFR calc non Af Amer: 45 mL/min — ABNORMAL LOW (ref >90)
Glucose, Bld: 132 mg/dL — ABNORMAL HIGH (ref 70–99)
Potassium: 4.5 mEq/L (ref 3.5–5.1)
Sodium: 154 mEq/L — ABNORMAL HIGH (ref 135–145)

## 2013-04-23 LAB — CULTURE, BLOOD (ROUTINE X 2)

## 2013-04-23 LAB — MAGNESIUM: Magnesium: 2.2 mg/dL (ref 1.5–2.5)

## 2013-04-23 MED ORDER — FREE WATER
100.0000 mL | Status: DC
  Administered 2013-04-23 (×4): 100 mL

## 2013-04-23 MED ORDER — DEXTROSE 5 % IV SOLN
1.0000 g | Freq: Two times a day (BID) | INTRAVENOUS | Status: DC
  Administered 2013-04-23 (×2): 1 g via INTRAVENOUS
  Filled 2013-04-23 (×3): qty 1

## 2013-04-23 MED ORDER — FUROSEMIDE 10 MG/ML IJ SOLN
40.0000 mg | Freq: Two times a day (BID) | INTRAMUSCULAR | Status: DC
  Administered 2013-04-23 – 2013-04-24 (×3): 40 mg via INTRAVENOUS
  Filled 2013-04-23 (×5): qty 4

## 2013-04-23 MED ORDER — METOCLOPRAMIDE HCL 5 MG/ML IJ SOLN
5.0000 mg | Freq: Four times a day (QID) | INTRAMUSCULAR | Status: AC
  Administered 2013-04-23 – 2013-04-26 (×12): 5 mg via INTRAVENOUS
  Filled 2013-04-23 (×2): qty 2
  Filled 2013-04-23 (×3): qty 1
  Filled 2013-04-23 (×2): qty 2
  Filled 2013-04-23 (×2): qty 1
  Filled 2013-04-23 (×3): qty 2
  Filled 2013-04-23 (×2): qty 1

## 2013-04-23 NOTE — Progress Notes (Signed)
Unable to obtain BALsample 60 cc saline instilled 0 cc sample returned.

## 2013-04-23 NOTE — Progress Notes (Addendum)
Name: Casey Davila MRN: [id number] DOB: April 27, 1926    ADMISSION DATE:  04-19-13 CONSULTATION DATE:  2013-04-19  REFERRING MD :  Romie Levee, MD PRIMARY SERVICE: Surgery  CHIEF COMPLAINT:   Post repair of incarcerated femoral hernia. Out of the OR intubated.   BRIEF PATIENT DESCRIPTION:  79 years old male with PMH relevant for HTN.  Underwent emergent repair and now out of the OR intubated in the ICU. CCM consult called to assist with ventilator management. . Admiotted April 19, 2013 10:12 AM    LINES / TUBES: 12/14 L IJ CVL >> 12/13 ETT >>  CULTURES: 12/13 - Urine 0- nega 12/14 resp: pan sens Klebsiella PNA  12/14 blood >> Klebsiella ................................ 12/18 BAL>>>GNR 04/20/13 - BAL Resp Virus PCR - NEGATIVE      ANTIBIOTICS: 12/13 vanc >>12/15 12/13 zosyn >>12/18 .................... 12/18 primaxin>>>12/21 12/18 vanc>>> .......... 04/23/13 - cefepime     SIGNIFICANT EVENTS / STUDIES:  12/14 CT angio chest/ab/pelv> esophagus full of fluid? (thick), nearly occludes left mainstem with external compression; left lower lung pulm nodule; aspiration pneumonitis bilateral base, SBO, R femoral hernia 12/14 afib with rvr/shock, attempted cardioversion x3 unsuccessful 12/18 FOB, copious thick secretions 04/22/13 - 70% fio2/10 peep. Agitated on WUA.  Not making progress. Stil febrile   SUBJECTIVE/OVERNIGHT/INTERVAL HX 04/23/13 - FEver worse.  Thick whitis sputum + Residuals 400 -> 125 and so free water on hold.  Oxygenation - 50%/peep 10. Worsening resp acidosis. Negative fluid balance  VITAL SIGNS: Temp:  [98.4 F (36.9 C)-102.3 F (39.1 C)] 101.5 F (38.6 C) (12/21 0800) Pulse Rate:  [95-115] 103 (12/21 0800) Resp:  [18-31] 21 (12/21 0800) BP: (95-151)/(41-74) 118/44 mmHg (12/21 0800) SpO2:  [92 %-97 %] 92 % (12/21 0843) FiO2 (%):  [50 %-70 %] 50 % (12/21 0843) Weight:  [79.3 kg (174 lb 13.2 oz)] 79.3 kg (174 lb 13.2 oz) (12/21  0500) HEMODYNAMICS: CVP:  [7 mmHg-12 mmHg] 12 mmHg VENTILATOR SETTINGS: Vent Mode:  [-] PRVC FiO2 (%):  [50 %-70 %] 50 % Set Rate:  [18 bmp] 18 bmp Vt Set:  [450 mL] 450 mL PEEP:  [10 cmH20] 10 cmH20 Plateau Pressure:  [20 cmH20-23 cmH20] 21 cmH20 INTAKE / OUTPUT: Intake/Output     12/20 0701 - 12/21 0700 12/21 0701 - 12/22 0700   I.V. (mL/kg) 1181.7 (14.9) 40 (0.5)   NG/GT 1565 95   IV Piggyback 600    Total Intake(mL/kg) 3346.7 (42.2) 135 (1.7)   Urine (mL/kg/hr) 1785 (0.9) 75 (0.4)   Total Output 1785 75   Net +1561.7 +60        Urine Occurrence 1 x      PHYSICAL EXAMINATION: General: comfortable on vent HEENT: NCAT, ETT in place PULM: Decreased t/o CV: RRR, no mgr, NSR on tele  AB: BS noted, non-tender Ext: warm, no edema, SCD's Neuro: arouses easily, follows commands  LABS: PULMONARY  Recent Labs Lab 04/17/13 0418 04/17/13 1057 04/18/13 0358 04/19/13 0342 04/23/13 0412  PHART 7.359 7.343* 7.329* 7.360 7.277*  PCO2ART 34.5* 37.9 40.4 46.4* 70.8*  PO2ART 69.5* 50.9* 61.3* 71.3* 88.4  HCO3 18.9* 20.1 20.7 25.4* 31.3*  TCO2 17.7 18.8 19.3 23.4 29.9  O2SAT 92.1 81.8 89.1 92.9 95.2    CBC  Recent Labs Lab 04/21/13 0500 04/22/13 0330 04/23/13 0500  HGB 9.8* 9.5* 8.5*  HCT 30.8* 29.4* 26.7*  WBC 14.5* 15.7* 13.8*  PLT 71* 85* 104*    COAGULATION No results found for this basename: INR,  in the last 168  hours  CARDIAC  No results found for this basename: TROPONINI,  in the last 168 hours  Recent Labs Lab 04/23/13 0500  PROBNP 821.7*     CHEMISTRY  Recent Labs Lab 04/18/13 0545 04/18/13 1448  04/20/13 0400 04/20/13 1405 04/21/13 0500 04/22/13 0330 04/23/13 0500  NA 137 140  < > 145 150* 152* 148* 154*  K 3.6 3.9  < > 3.4* 3.8 3.9 4.0 4.5  CL 106 108  < > 108 113* 116* 112 118*  CO2 21 23  < > 30 30 30 30  32  GLUCOSE 97 86  < > 128* 147* 146* 130* 132*  BUN 39* 38*  < > 45* 48* 52* 51* 58*  CREATININE 0.87 0.94  < > 1.05 1.09  1.04 1.05 1.37*  CALCIUM 8.2* 8.4  < > 9.0 8.8 8.8 9.0 9.0  MG  --  2.2  --   --  2.0  --   --  2.2  PHOS  --   --   --   --   --   --   --  3.4  < > = values in this interval not displayed. Estimated Creatinine Clearance: 42 ml/min (by C-G formula based on Cr of 1.37).   LIVER  Recent Labs Lab 04/19/13 0427 04/21/13 0500 04/22/13 0330  AST 21 24 34  ALT 13 14 18   ALKPHOS 94 111 115  BILITOT 1.2 3.0* 2.9*  PROT 5.3* 5.1* 4.9*  ALBUMIN 1.8* 1.6* 1.4*     INFECTIOUS  Recent Labs Lab 04/20/13 0400 04/21/13 0500 04/22/13 0330  PROCALCITON 4.44 2.75 1.90     ENDOCRINE CBG (last 3)   Recent Labs  04/23/13 0008 04/23/13 0557 04/23/13 0822  GLUCAP 136* 136* 137*         IMAGING x48h  Dg Chest Port 1 View  04/22/2013   CLINICAL DATA:  Repositioning of endotracheal tube.  EXAM: PORTABLE CHEST - 1 VIEW  COMPARISON:  Chest x-ray of April 22, 2013.  FINDINGS: The endotracheal tube tip lies approximately 1 cm below the inferior margin of the clavicular heads and approximately 5 cm above the crotch of the carina. The lungs are adequately inflated. The interstitial markings remain increased especially on the right where they become nearly confluent in the infrahilar region. The retrocardiac region on the left is perhaps slightly less dense than on the earlier study. The cardiopericardial silhouette is not enlarged. The pulmonary vascularity is indistinct. There is no significant pleural effusion. The esophagogastric tube tip and proximal port project below the inferior margin of the film. The right internal jugular venous catheter tip lies in the region of proximal SVC.  IMPRESSION: 1. The endotracheal tube appears to be in reasonable position. The other support tubes and lines appear stable. 2. There is persistent abnormally increased interstitial density in both lungs especially on the right in the infrahilar region consistent with asymmetric pulmonary edema or other  alveolar filling processes. .   Electronically Signed   By: David  [place]   On: 04/22/2013 11:32   Dg Chest Port 1 View  04/22/2013   CLINICAL DATA:  Evaluate endotracheal tube.  EXAM: PORTABLE CHEST - 1 VIEW  COMPARISON:  04/21/2013.  FINDINGS: Cardiopericardial silhouette is within normal limits. Retrocardiac density is present likely representing atelectasis and airspace disease in combination. Enteric tube is present in the esophagus with the tip not visualized left IJ central line remains present with the tip in the upper SVC. Asymmetric right-greater-than-left airspace disease  is present. The right basilar airspace disease has increased compared to yesterday's exam. Given the interval, this is most compatible with increasing asymmetric pulmonary edema. Superimposed pneumonia may be present.  IMPRESSION: 1. Stable support apparatus. 2. Increasing right chest airspace disease likely represents pulmonary edema given knee increased since yesterday's study. Underlying ARDS or pneumonia may also be present.   Electronically Signed   By: Andreas Newport M.D.   On: 04/22/2013 07:36       ASSESSMENT / PLAN:  PULMONARY A: 1) Acute hypoxemic respiratory failure due to aspiration pneumonitis/ ARDS  04/22/13  - Persistent ARDS. Net 3L positive past 7 days 12./21/14 - Improved to 50% fio2/peep 10 P:   -Full vent support -Cont TVol @ 6cc/kg -See ID sectiona - due to high f FIO2 requirements, get CT chest -ABG/CXR/WUA/SBT 12/19 am    CARDIOVASCULAR A:  1) Septic shock > resolved 2) New onset afib with RVR due to sepsis and levophed, failed cardioversion; converted with amiodarone, Now NSR w/ occ PAC, had Run of SVT 12/19 04/23/13 - sinus tach  P:  PRN lopressor KVO IVFs Think we can avoid full dose anticoagulation for now, but if goes back into AF will need to consider  RENAL A:   Acute renal failure> resolved Hypernatremia s/p diuresis , and worsening Also 3rd spacing  P:   Free  water (gut better route, residuals are < 400cc) + lasix F/u chem Restart diuresis 12/21 Check bnp prn  GASTROINTESTINAL A:   1) R femoral hernia and SBO, viable bowel; Status post surgical repair  04/23/13 - residuals + but < 400cc P:   - GI prophylaxis with protonix - slow advance tubefeeds   HEMATOLOGIC A:   ANemia of critical illness Mild thrombocytopenia (dropped 50%) due to illness: Plts holding in the 70-80 range. Changed to LMWH on 12/16.  HTT negative 12/16//14   P:  - PRBC for hgb </= 6.9gm%    - exceptions are   -  if ACS susepcted/confirmed then transfuse for hgb </= 8.0gm%,  or    - active bleeding with hemodynamic instability, then transfuse regardless of hemoglobin value   At at all times try to transfuse 1 unit prbc as possible with exception of active hemorrhage    INFECTIOUS A:   1) Aspiration PNA + Klebsiella PNA (Pan sens)   04/23/13  - fever curve worse despite change to primaxin on 04/20/13  P:   - change primaxin to cefepime - continue vanc - see above, f/u culture data narrow when able  - trend PCT  - depending on course, might have to add anti fungal  ENDOCRINE A:   1) No issues    NEUROLOGIC A:   1) Intubated, sedated  04/23/13 - Agiated on WUA  P:   - Sedation and pain management with fentanyl - RASS score goal of -1  Global:   04/21/13  PCCM MD Discussed with wife at length on 12/19:  he doesn't want CPR, but since we feel that there is still hope he will improve, he would want [place] to continue with aggressive care otherwise.  I advised her this will be a long course.LIMITED CODE BLUE (No CPR or shocks, all else OK)   04/23/13 - 67th wedding anniversary today. No kids. No local family for either. Wife underfstands this is a lonog haul in order of weeks or months. She says she is ready for it and "do whatever it takes"   The patient is critically ill  with multiple organ systems failure and requires high complexity decision  making for assessment and support, frequent evaluation and titration of therapies, application of advanced monitoring technologies and extensive interpretation of multiple databases.   Critical Care Time devoted to patient care services described in this note is  45  Minutes.  Dr. Kalman Shan, M.D., [facility].C.P Pulmonary and Critical Care Medicine Staff Physician [facility] Pager: [phone], If no answer or between  15:00h - 7:00h: call [phone]  04/23/2013 9:26 AM

## 2013-04-23 NOTE — Progress Notes (Signed)
1600 Patient's gastric tube residual 650cc.  MD notified, tube feeds and free water flushes on hold overnight, to be readdressed in the AM.  Will continue to monitor.

## 2013-04-23 NOTE — Progress Notes (Signed)
KEENE GILKEY [id number] 05/19/1925  CARE TEAM:  PCP: Minda Meo, MD  Outpatient Care Team: Patient Care Team: Minda Meo, MD as PCP - General (Internal Medicine)  Inpatient Treatment Team: Treatment Team: Attending Provider: Lupita Leash, MD; Rounding Team: Md Pccm, MD; Dietitian: Lavena Bullion, RD; Consulting Physician: Bishop Limbo, MD; Registered Nurse: Roxy Manns, RN; Registered Nurse: Glean Salvo, RN   Subjective:  No major events Wife at bedside ICU RN Corrie Dandy at bedside No major BM yet ? Small smear Persistent grey/green secretions.  Not c/w TFs   Objective:  Vital signs:  Filed Vitals:   04/23/13 0303 04/23/13 0400 04/23/13 0500 04/23/13 0600  BP:  120/43 125/43 120/44  Pulse:  104 104 110  Temp:      TempSrc:      Resp:  24 21 26   Height:      Weight:   174 lb 13.2 oz (79.3 kg)   SpO2: 95% 95% 95% 96%       Intake/Output   Yesterday:  12/20 0701 - 12/21 0700 In: 3226.7 [I.V.:1126.7; NG/GT:1500; IV Piggyback:600] Out: 1785 [Urine:1785] This shift:     Bowel function:  Flatus: Y?  BM: Smear?  Drain: Residuals TFs 400, 10, 0 ...  Physical Exam:  General: Pt intubated/sedated  Eyes: PERRL rxtive. Sclera clear.  No icterus Neuro: Moves all 4 ext w/o focal sensory/motor deficits. Lymph: No head/neck/groin lymphadenopathy Psych:  No delerium/psychosis/paranoia HENT: Normocephalic, Mucus membranes moist.  No thrush Neck: Supple, No tracheal deviation Chest: Coarse BS w rhonchi bilat.  Good excursion CV:  Pulses intact.  Regular rhythm MS: Normal AROM mjr joints.  No obvious deformity Abdomen: Soft.  Nondistended.  No evidence of peritonitis.  No incarcerated hernias. GU:  NEMG.  R groin incision clean Ext:  SCDs BLE.  2+ anasarca.   No cyanosis Skin: No petechiae / purpura   Problem List:   Principal Problem:   ARDS (adult respiratory distress syndrome) Active Problems:   Incarcerated femoral hernia s/p open repair  04/11/2013   Acute respiratory failure   Sepsis(995.91)   AKI (acute kidney injury)   Hypokalemia   Thrombocytopathia   A-fib   Protein-calorie malnutrition, severe   Assessment  [name]  79 y.o. male  8 Days Post-Op  Procedure(s): OPEN REPAIR OF FEMORAL HERNIA  Fair but stable  Plan:  -vol overload - +5L still w anasarca prob related to deconditioning/prot cal malnut.  needs diuresis but challenge w Afib/ST - consider restarting Lasix as tolerated -stop free water GT boluses - he doesn't need more volume & makes risk of aspiration higher.  Would treat hypernatremia w IVF D5W - cannot trust GI tract at this time - defer to Pulmon/CCM.  Consider LAsix  -OK keep TFs to goal but follow residuals closely.  Add reglan to help gastric emptying -try supp daily -IV ABx for aspiration PNA.  Falling WBC & stable vent settings hopeful sign. -Afib/ST - per CCM/Cards ? Restart Bblocker/amio if persistent inc HR? -VTE prophylaxis- SCDs, etc -mobilize as tolerated to help recovery  I updated the patient's status to the pt's wife, Wilma, & ICU RNs.  Recommendations were made.  Questions were answered.  The family expressed understanding & appreciation.   Ardeth Sportsman, M.D., F.A.C.S. [facility] [address] [place], [place] [address] [phone] Main / Paging   04/23/2013   Results:   Labs: Results for orders  placed during the hospital encounter of 05-14-2013 (from the past 48 hour(s))  GLUCOSE, CAPILLARY     Status: Abnormal   Collection Time    04/21/13  8:05 AM      Result Value Range   Glucose-Capillary 126 (*) 70 - 99 mg/dL  GLUCOSE, CAPILLARY     Status: Abnormal   Collection Time    04/21/13 12:02 PM      Result Value Range   Glucose-Capillary 135 (*) 70 - 99 mg/dL  GLUCOSE, CAPILLARY     Status: Abnormal   Collection Time    04/21/13  4:51 PM      Result Value Range    Glucose-Capillary 119 (*) 70 - 99 mg/dL  GLUCOSE, CAPILLARY     Status: Abnormal   Collection Time    04/21/13  8:39 PM      Result Value Range   Glucose-Capillary 126 (*) 70 - 99 mg/dL  GLUCOSE, CAPILLARY     Status: Abnormal   Collection Time    04/22/13 12:21 AM      Result Value Range   Glucose-Capillary 139 (*) 70 - 99 mg/dL  PROCALCITONIN     Status: None   Collection Time    04/22/13  3:30 AM      Result Value Range   Procalcitonin 1.90     Comment:            Interpretation:     PCT > 0.5 ng/mL and <= 2 ng/mL:     Systemic infection (sepsis) is possible,     but other conditions are known to elevate     PCT as well.     (NOTE)             ICU PCT Algorithm               Non ICU PCT Algorithm        ----------------------------     ------------------------------             PCT < 0.25 ng/mL                 PCT < 0.1 ng/mL         Stopping of antibiotics            Stopping of antibiotics           strongly encouraged.               strongly encouraged.        ----------------------------     ------------------------------           PCT level decrease by               PCT < 0.25 ng/mL           >= 80% from peak PCT           OR PCT 0.25 - 0.5 ng/mL          Stopping of antibiotics                                                 encouraged.         Stopping of antibiotics               encouraged.        ----------------------------     ------------------------------  PCT level decrease by              PCT >= 0.25 ng/mL           < 80% from peak PCT            AND PCT >= 0.5 ng/mL            Continuing antibiotics                                                  encouraged.           Continuing antibiotics                encouraged.        ----------------------------     ------------------------------         PCT level increase compared          PCT > 0.5 ng/mL             with peak PCT AND              PCT >= 0.5 ng/mL             Escalation of antibiotics                                               strongly encouraged.          Escalation of antibiotics            strongly encouraged.  CBC     Status: Abnormal   Collection Time    04/22/13  3:30 AM      Result Value Range   WBC 15.7 (*) 4.0 - 10.5 K/uL   RBC 3.52 (*) 4.22 - 5.81 MIL/uL   Hemoglobin 9.5 (*) 13.0 - 17.0 g/dL   HCT 16.1 (*) 09.6 - 04.5 %   MCV 83.5  78.0 - 100.0 fL   MCH 27.0  26.0 - 34.0 pg   MCHC 32.3  30.0 - 36.0 g/dL   RDW 40.9 (*) 81.1 - 91.4 %   Platelets 85 (*) 150 - 400 K/uL   Comment: REPEATED TO VERIFY     SPECIMEN CHECKED FOR CLOTS     PLATELET COUNT CONFIRMED BY SMEAR  COMPREHENSIVE METABOLIC PANEL     Status: Abnormal   Collection Time    04/22/13  3:30 AM      Result Value Range   Sodium 148 (*) 135 - 145 mEq/L   Potassium 4.0  3.5 - 5.1 mEq/L   Chloride 112  96 - 112 mEq/L   CO2 30  19 - 32 mEq/L   Glucose, Bld 130 (*) 70 - 99 mg/dL   BUN 51 (*) 6 - 23 mg/dL   Creatinine, Ser 7.82  0.50 - 1.35 mg/dL   Calcium 9.0  8.4 - 95.6 mg/dL   Total Protein 4.9 (*) 6.0 - 8.3 g/dL   Albumin 1.4 (*) 3.5 - 5.2 g/dL   AST 34  0 - 37 U/L   ALT 18  0 - 53 U/L   Alkaline Phosphatase 115  39 - 117 U/L   Total Bilirubin 2.9 (*) 0.3 - 1.2 mg/dL   GFR calc non Af Denyse Dago  63 (*) >90 mL/min   GFR calc Af Amer 73 (*) >90 mL/min   Comment: (NOTE)     The eGFR has been calculated using the CKD EPI equation.     This calculation has not been validated in all clinical situations.     eGFR's persistently <90 mL/min signify possible Chronic Kidney     Disease.  GLUCOSE, CAPILLARY     Status: Abnormal   Collection Time    04/22/13  4:23 AM      Result Value Range   Glucose-Capillary 108 (*) 70 - 99 mg/dL   Comment 1 Documented in Chart     Comment 2 Notify RN    GLUCOSE, CAPILLARY     Status: Abnormal   Collection Time    04/22/13  7:53 AM      Result Value Range   Glucose-Capillary 131 (*) 70 - 99 mg/dL   Comment 1 Documented in Chart     Comment 2 Notify RN     GLUCOSE, CAPILLARY     Status: Abnormal   Collection Time    04/22/13 12:31 PM      Result Value Range   Glucose-Capillary 124 (*) 70 - 99 mg/dL   Comment 1 Documented in Chart     Comment 2 Notify RN    GLUCOSE, CAPILLARY     Status: Abnormal   Collection Time    04/22/13  4:32 PM      Result Value Range   Glucose-Capillary 114 (*) 70 - 99 mg/dL  GLUCOSE, CAPILLARY     Status: Abnormal   Collection Time    04/22/13  8:57 PM      Result Value Range   Glucose-Capillary 126 (*) 70 - 99 mg/dL  VANCOMYCIN, TROUGH     Status: None   Collection Time    04/22/13  9:09 PM      Result Value Range   Vancomycin Tr 13.9  10.0 - 20.0 ug/mL  BLOOD GAS, ARTERIAL     Status: Abnormal   Collection Time    04/23/13  4:12 AM      Result Value Range   FIO2 0.50     Delivery systems VENTILATOR     Mode PRESSURE REGULATED VOLUME CONTROL     VT 0.450     Rate 18.0     Peep/cpap 10.0     pH, Arterial 7.277 (*) 7.350 - 7.450   pCO2 arterial 70.8 (*) 35.0 - 45.0 mmHg   Comment: CRITICAL RESULT CALLED TO, READ BACK BY AND VERIFIED WITH:      Lasandra Beech, RN AT [phone] BY CHELSEA HOLCOMB, RRT, RCP ON 04/23/2013   pO2, Arterial 88.4  80.0 - 100.0 mmHg   Bicarbonate 31.3 (*) 20.0 - 24.0 mEq/L   TCO2 29.9  0 - 100 mmol/L   Acid-Base Excess 4.2 (*) 0.0 - 2.0 mmol/L   O2 Saturation 95.2     Patient temperature 101.5     Collection site RIGHT RADIAL     Drawn by [id number]     Sample type ARTERIAL DRAW     Allens test (pass/fail) PASS  PASS  PRO B NATRIURETIC PEPTIDE     Status: Abnormal   Collection Time    04/23/13  5:00 AM      Result Value Range   Pro B Natriuretic peptide (BNP) 821.7 (*) 0 - 450 pg/mL  PHOSPHORUS     Status: None   Collection Time    04/23/13  5:00 AM  Result Value Range   Phosphorus 3.4  2.3 - 4.6 mg/dL  MAGNESIUM     Status: None   Collection Time    04/23/13  5:00 AM      Result Value Range   Magnesium 2.2  1.5 - 2.5 mg/dL  CBC WITH DIFFERENTIAL     Status: Abnormal    Collection Time    04/23/13  5:00 AM      Result Value Range   WBC 13.8 (*) 4.0 - 10.5 K/uL   RBC 3.12 (*) 4.22 - 5.81 MIL/uL   Hemoglobin 8.5 (*) 13.0 - 17.0 g/dL   HCT 16.1 (*) 09.6 - 04.5 %   MCV 85.6  78.0 - 100.0 fL   MCH 27.2  26.0 - 34.0 pg   MCHC 31.8  30.0 - 36.0 g/dL   RDW 40.9 (*) 81.1 - 91.4 %   Platelets 104 (*) 150 - 400 K/uL   Comment: CONSISTENT WITH PREVIOUS RESULT   Neutrophils Relative % 87 (*) 43 - 77 %   Lymphocytes Relative 8 (*) 12 - 46 %   Monocytes Relative 5  3 - 12 %   Eosinophils Relative 0  0 - 5 %   Basophils Relative 0  0 - 1 %   nRBC 1 (*) 0 /100 WBC   Neutro Abs 12.0 (*) 1.7 - 7.7 K/uL   Lymphs Abs 1.1  0.7 - 4.0 K/uL   Monocytes Absolute 0.7  0.1 - 1.0 K/uL   Eosinophils Absolute 0.0  0.0 - 0.7 K/uL   Basophils Absolute 0.0  0.0 - 0.1 K/uL   RBC Morphology RARE NRBCs     Comment: TARGET CELLS   WBC Morphology MILD LEFT SHIFT (1-5% METAS, OCC MYELO, OCC BANDS)    BASIC METABOLIC PANEL     Status: Abnormal   Collection Time    04/23/13  5:00 AM      Result Value Range   Sodium 154 (*) 135 - 145 mEq/L   Potassium 4.5  3.5 - 5.1 mEq/L   Chloride 118 (*) 96 - 112 mEq/L   CO2 32  19 - 32 mEq/L   Glucose, Bld 132 (*) 70 - 99 mg/dL   BUN 58 (*) 6 - 23 mg/dL   Creatinine, Ser 7.82 (*) 0.50 - 1.35 mg/dL   Calcium 9.0  8.4 - 95.6 mg/dL   GFR calc non Af Amer 45 (*) >90 mL/min   GFR calc Af Amer 53 (*) >90 mL/min   Comment: (NOTE)     The eGFR has been calculated using the CKD EPI equation.     This calculation has not been validated in all clinical situations.     eGFR's persistently <90 mL/min signify possible Chronic Kidney     Disease.  GLUCOSE, CAPILLARY     Status: Abnormal   Collection Time    04/23/13  5:57 AM      Result Value Range   Glucose-Capillary 136 (*) 70 - 99 mg/dL    Imaging / Studies: Dg Chest Port 1 View  04/22/2013   CLINICAL DATA:  Repositioning of endotracheal tube.  EXAM: PORTABLE CHEST - 1 VIEW  COMPARISON:   Chest x-ray of April 22, 2013.  FINDINGS: The endotracheal tube tip lies approximately 1 cm below the inferior margin of the clavicular heads and approximately 5 cm above the crotch of the carina. The lungs are adequately inflated. The interstitial markings remain increased especially on the right where they become nearly confluent in  the infrahilar region. The retrocardiac region on the left is perhaps slightly less dense than on the earlier study. The cardiopericardial silhouette is not enlarged. The pulmonary vascularity is indistinct. There is no significant pleural effusion. The esophagogastric tube tip and proximal port project below the inferior margin of the film. The right internal jugular venous catheter tip lies in the region of proximal SVC.  IMPRESSION: 1. The endotracheal tube appears to be in reasonable position. The other support tubes and lines appear stable. 2. There is persistent abnormally increased interstitial density in both lungs especially on the right in the infrahilar region consistent with asymmetric pulmonary edema or other alveolar filling processes. .   Electronically Signed   By: David  [place]   On: 04/22/2013 11:32   Dg Chest Port 1 View  04/22/2013   CLINICAL DATA:  Evaluate endotracheal tube.  EXAM: PORTABLE CHEST - 1 VIEW  COMPARISON:  04/21/2013.  FINDINGS: Cardiopericardial silhouette is within normal limits. Retrocardiac density is present likely representing atelectasis and airspace disease in combination. Enteric tube is present in the esophagus with the tip not visualized left IJ central line remains present with the tip in the upper SVC. Asymmetric right-greater-than-left airspace disease is present. The right basilar airspace disease has increased compared to yesterday's exam. Given the interval, this is most compatible with increasing asymmetric pulmonary edema. Superimposed pneumonia may be present.  IMPRESSION: 1. Stable support apparatus. 2. Increasing right  chest airspace disease likely represents pulmonary edema given knee increased since yesterday's study. Underlying ARDS or pneumonia may also be present.   Electronically Signed   By: Andreas Newport M.D.   On: 04/22/2013 07:36    Medications / Allergies: per chart  Antibiotics: Anti-infectives   Start     Dose/Rate Route Frequency Ordered Stop   04/20/13 2200  vancomycin (VANCOCIN) IVPB 750 mg/150 ml premix     750 mg 150 mL/hr over 60 Minutes Intravenous Every 12 hours 04/20/13 0903     04/20/13 1200  imipenem-cilastatin (PRIMAXIN) 500 mg in sodium chloride 0.9 % 100 mL IVPB     500 mg 200 mL/hr over 30 Minutes Intravenous Every 8 hours 04/20/13 1009     04/20/13 0930  vancomycin (VANCOCIN) IVPB 1000 mg/200 mL premix     1,000 mg 200 mL/hr over 60 Minutes Intravenous  Once 04/20/13 0903 04/20/13 1100   04/16/13 0600  vancomycin (VANCOCIN) IVPB 750 mg/150 ml premix  Status:  Discontinued     750 mg 150 mL/hr over 60 Minutes Intravenous Every 12 hours 05-08-2013 1400 04/17/13 0858   05/08/2013 2200  piperacillin-tazobactam (ZOSYN) IVPB 3.375 g  Status:  Discontinued     3.375 g 12.5 mL/hr over 240 Minutes Intravenous 3 times per day 2013-05-08 1400 04/20/13 0947   May 08, 2013 1530  vancomycin (VANCOCIN) 1,406 mg in sodium chloride 0.9 % 500 mL IVPB  Status:  Discontinued     20 mg/kg  70.3 kg 250 mL/hr over 120 Minutes Intravenous  Once May 08, 2013 1525 2013/05/08 1525   05-08-2013 1530  piperacillin-tazobactam (ZOSYN) IVPB 3.375 g  Status:  Discontinued     3.375 g 100 mL/hr over 30 Minutes Intravenous  Once 05/08/13 1525 2013-05-08 1525   08-May-2013 1430  [MAR Hold]  vancomycin (VANCOCIN) IVPB 1000 mg/200 mL premix     (On MAR Hold since 2013/05/08 1630)   1,000 mg 200 mL/hr over 60 Minutes Intravenous STAT 05/08/2013 1358 May 08, 2013 1632   08-May-2013 1430  piperacillin-tazobactam (ZOSYN) IVPB 3.375 g  3.375 g 100 mL/hr over 30 Minutes Intravenous STAT 04/03/2013 1358 04/06/2013 1514      Note: This  dictation was prepared with Dragon/digital dictation along with [employer]. Any transcriptional errors that result from this process are unintentional.

## 2013-04-23 NOTE — Progress Notes (Addendum)
ANTIBIOTIC CONSULT NOTE - Follow Up  Pharmacy Consult for Vancomycin, Cefepime Indication: Klebsiella PNA and bacteremia, HCAP  Allergies  Allergen Reactions  . Vicodin [Hydrocodone-Acetaminophen] Nausea And Vomiting    Patient Measurements: Height: 5\' 11"  (180.3 cm) Weight: 174 lb 13.2 oz (79.3 kg) IBW/kg (Calculated) : 75.3  Vital Signs: Temp: 101.5 F (38.6 C) (12/21 0800) Temp src: Oral (12/21 0800) BP: 118/44 mmHg (12/21 0800) Pulse Rate: 103 (12/21 0800)  Labs:  Recent Labs  04/21/13 0500 04/22/13 0330 04/23/13 0500  WBC 14.5* 15.7* 13.8*  HGB 9.8* 9.5* 8.5*  PLT 71* 85* 104*  CREATININE 1.04 1.05 1.37*    Assessment: 79yo M from [facility] with incarcerated femoral hernia s/p repair 12/13 and admitted to the ICU on ventilator. CXR on admission with possible PNA, V/Z started then narrowed down to Zosyn alone given likely aspiration PNA. Moderate klebsiella pneumoniae was cultured from sputum. This AM, pt spike a fever - adding back vancomycin, and replacing Zosyn with Imipenem for HCAP.   12/13 >> Zosyn >> 12/18 12/13 >> Vanc >> 12/15, resume 12/18 >>  12/18 >> Imipenem >> 12/21 12/21 >> Cefepime >>  Tmax: 102.3  WBCs: 13.8 Renal: Scr 1.37, up some, CG 42 12/18 PCT >> ordered  12/13 urine >> 7K insignificant growth 12/14 blood >> klebsiella 12/14 sputum >> mod klebsiella pneumoniae (only resistant to ampicillin) 12/18 respiratory >> ordered 12/18 BAL - GNR  Today is D#9 total abx - D#4 Vancomycin and will be D#1 Cefepime (Primaxin d/c'd today after 8 days and changed to Cefepime due to continued fevers on Primaxin). CXR with stable patchy alveolar opacity, no change today. Plan bronch and culture.   Goal of Therapy:  Vancomycin trough level 15-20 mcg/ml  Plan:  1) Continue vancomycin 750mg  q12 - trough yesterday 13.9 2) Start cefepime 1g q12 for CrCl 30-60 ml/min   Hessie Knows, PharmD, BCPS Pager [phone] 04/23/2013 10:13 AM

## 2013-04-24 ENCOUNTER — Inpatient Hospital Stay (HOSPITAL_COMMUNITY): Payer: Medicare Other

## 2013-04-24 DIAGNOSIS — IMO0001 Reserved for inherently not codable concepts without codable children: Principal | ICD-10-CM

## 2013-04-24 LAB — BLOOD GAS, ARTERIAL
Bicarbonate: 30.5 mEq/L — ABNORMAL HIGH (ref 20.0–24.0)
MECHVT: 450 mL
PEEP: 5 cmH2O
TCO2: 29.4 mmol/L (ref 0–100)
pH, Arterial: 7.326 — ABNORMAL LOW (ref 7.350–7.450)
pO2, Arterial: 64.8 mmHg — ABNORMAL LOW (ref 80.0–100.0)

## 2013-04-24 LAB — CBC WITH DIFFERENTIAL/PLATELET
Basophils Relative: 0 % (ref 0–1)
Eosinophils Absolute: 0 10*3/uL (ref 0.0–0.7)
Eosinophils Relative: 0 % (ref 0–5)
HCT: 26.6 % — ABNORMAL LOW (ref 39.0–52.0)
Hemoglobin: 8.3 g/dL — ABNORMAL LOW (ref 13.0–17.0)
Lymphocytes Relative: 9 % — ABNORMAL LOW (ref 12–46)
Lymphs Abs: 1.4 10*3/uL (ref 0.7–4.0)
MCH: 26.7 pg (ref 26.0–34.0)
MCHC: 31.2 g/dL (ref 30.0–36.0)
MCV: 85.5 fL (ref 78.0–100.0)
Monocytes Absolute: 0.8 10*3/uL (ref 0.1–1.0)
Neutro Abs: 13.8 10*3/uL — ABNORMAL HIGH (ref 1.7–7.7)
RBC: 3.11 MIL/uL — ABNORMAL LOW (ref 4.22–5.81)
RDW: 17.2 % — ABNORMAL HIGH (ref 11.5–15.5)
WBC: 16 10*3/uL — ABNORMAL HIGH (ref 4.0–10.5)

## 2013-04-24 LAB — PROTIME-INR
INR: 1.21 (ref 0.00–1.49)
Prothrombin Time: 15 seconds (ref 11.6–15.2)

## 2013-04-24 LAB — GLUCOSE, CAPILLARY
Glucose-Capillary: 104 mg/dL — ABNORMAL HIGH (ref 70–99)
Glucose-Capillary: 116 mg/dL — ABNORMAL HIGH (ref 70–99)
Glucose-Capillary: 124 mg/dL — ABNORMAL HIGH (ref 70–99)
Glucose-Capillary: 138 mg/dL — ABNORMAL HIGH (ref 70–99)
Glucose-Capillary: 143 mg/dL — ABNORMAL HIGH (ref 70–99)

## 2013-04-24 LAB — PHOSPHORUS: Phosphorus: 5.1 mg/dL — ABNORMAL HIGH (ref 2.3–4.6)

## 2013-04-24 LAB — HEPARIN LEVEL (UNFRACTIONATED): Heparin Unfractionated: 0.22 IU/mL — ABNORMAL LOW (ref 0.30–0.70)

## 2013-04-24 LAB — BASIC METABOLIC PANEL
CO2: 30 mEq/L (ref 19–32)
Chloride: 112 mEq/L (ref 96–112)
Creatinine, Ser: 2.24 mg/dL — ABNORMAL HIGH (ref 0.50–1.35)
Potassium: 4.6 mEq/L (ref 3.5–5.1)

## 2013-04-24 LAB — LACTIC ACID, PLASMA: Lactic Acid, Venous: 1 mmol/L (ref 0.5–2.2)

## 2013-04-24 MED ORDER — ENOXAPARIN SODIUM 30 MG/0.3ML ~~LOC~~ SOLN: 30.0000 mg | Freq: Every day | SUBCUTANEOUS | Status: DC

## 2013-04-24 MED ORDER — VITAL AF 1.2 CAL PO LIQD
1000.0000 mL | ORAL | Status: DC
  Administered 2013-04-24: 1000 mL
  Filled 2013-04-24: qty 1000

## 2013-04-24 MED ORDER — HEPARIN (PORCINE) IN NACL 100-0.45 UNIT/ML-% IJ SOLN
1200.0000 [IU]/h | INTRAMUSCULAR | Status: DC
  Administered 2013-04-24: 1200 [IU]/h via INTRAVENOUS
  Filled 2013-04-24: qty 250

## 2013-04-24 MED ORDER — FREE WATER
250.0000 mL | Freq: Four times a day (QID) | Status: DC
  Administered 2013-04-24 – 2013-04-28 (×15): 250 mL

## 2013-04-24 MED ORDER — FUROSEMIDE 10 MG/ML IJ SOLN
5.0000 mg/h | INTRAVENOUS | Status: AC
  Administered 2013-04-24: 5 mg/h via INTRAVENOUS
  Filled 2013-04-24: qty 25

## 2013-04-24 MED ORDER — AMIODARONE HCL IN DEXTROSE 360-4.14 MG/200ML-% IV SOLN
INTRAVENOUS | Status: AC
  Filled 2013-04-24: qty 200

## 2013-04-24 MED ORDER — AMIODARONE IV BOLUS ONLY 150 MG/100ML
INTRAVENOUS | Status: AC
  Filled 2013-04-24: qty 100

## 2013-04-24 MED ORDER — AMIODARONE LOAD VIA INFUSION
150.0000 mg | Freq: Once | INTRAVENOUS | Status: AC
  Administered 2013-04-24: 150 mg via INTRAVENOUS
  Filled 2013-04-24: qty 83.34

## 2013-04-24 MED ORDER — VANCOMYCIN HCL IN DEXTROSE 750-5 MG/150ML-% IV SOLN
750.0000 mg | INTRAVENOUS | Status: DC
  Administered 2013-04-24 – 2013-04-25 (×2): 750 mg via INTRAVENOUS
  Filled 2013-04-24 (×3): qty 150

## 2013-04-24 MED ORDER — OXEPA PO LIQD
1000.0000 mL | ORAL | Status: DC
  Administered 2013-04-24: 1000 mL
  Filled 2013-04-24 (×2): qty 1000

## 2013-04-24 MED ORDER — AMIODARONE HCL IN DEXTROSE 360-4.14 MG/200ML-% IV SOLN
30.0000 mg/h | INTRAVENOUS | Status: DC
  Administered 2013-04-24 – 2013-04-25 (×5): 60 mg/h via INTRAVENOUS
  Administered 2013-04-26: 30 mg/h via INTRAVENOUS
  Administered 2013-04-26: 60 mg/h via INTRAVENOUS
  Administered 2013-04-27 – 2013-04-28 (×3): 30 mg/h via INTRAVENOUS
  Filled 2013-04-24 (×10): qty 200

## 2013-04-24 MED ORDER — DEXTROSE 5 % IV SOLN
1.0000 g | INTRAVENOUS | Status: DC
  Administered 2013-04-24 – 2013-04-27 (×4): 1 g via INTRAVENOUS
  Filled 2013-04-24 (×6): qty 1

## 2013-04-24 MED ORDER — HEPARIN (PORCINE) IN NACL 100-0.45 UNIT/ML-% IJ SOLN
1000.0000 [IU]/h | INTRAMUSCULAR | Status: DC
  Administered 2013-04-24: 1000 [IU]/h via INTRAVENOUS
  Filled 2013-04-24: qty 250

## 2013-04-24 MED ORDER — AMIODARONE HCL IN DEXTROSE 360-4.14 MG/200ML-% IV SOLN
60.0000 mg/h | INTRAVENOUS | Status: AC
  Administered 2013-04-24 (×2): 60 mg/h via INTRAVENOUS
  Filled 2013-04-24 (×2): qty 200

## 2013-04-24 NOTE — Progress Notes (Signed)
CARE MANAGEMENT NOTE 04/24/2013  Patient:  Casey Davila, Casey Davila   Account Number:  [id number]  Date Initiated:  04/17/2013  Documentation initiated by:  Joah Patlan  Subjective/Objective Assessment:   pt with surgical intervention for a incarcerated femorial hernia, unable to extubated post or     Action/Plan:   lives in the alf at the [facility] with home pt and an rn   Anticipated DC Date:  04/27/2013   Anticipated DC Plan:  SKILLED NURSING FACILITY  In-house referral  Clinical Social Worker      DC Associate Professor  CM consult      [facility]  HOME HEALTH   Choice offered to / List presented to:  NA   DME arranged  NA      DME agency  NA     HH arranged  NA      HH agency  NA   Status of service:  In process, will continue to follow Medicare Important Message given?  NA - LOS <3 / Initial given by admissions (If response is "NO", the following Medicare IM given date fields will be blank) Date Medicare IM given:   Date Additional Medicare IM given:    Discharge Disposition:    Per UR Regulation:  Reviewed for med. necessity/level of care/duration of stay  If discussed at Long Length of Stay Meetings, dates discussed:   04/20/2013    Comments:  [id number] Earlene Plater, RN, BSN, [place] [phone] Chart Reviewed for discharge and hospital needs. PATIENT MADE A DNR [id number], terminal wean planned for after the Christmas Holiday when family can arrive. Discharge needs at time of review:  None present will follow for needs. Review of patient progress due on [id number].   [id number] Earlene Plater RN, BSN, [place] [phone] Chart Reviewed for discharge and hospital needs. Bronchscopy on [id number], vent , a.fib with rvr. Discharge needs at time of review:  None present will follow for needs. Review of patient progress due on [id number].   [id number] Earlene Plater RN, BSN, [place] [phone] Chart Reviewed for discharge and hospital needs. Discharge needs at time of  review:  None present will follow for needs. From assisted living at the [facility] home but may need str or snf. Review of patient progress due on [id number].

## 2013-04-24 NOTE — Progress Notes (Signed)
ANTICOAGULATION CONSULT NOTE - Initial Consult  Pharmacy Consult for Heparin Indication: atrial fibrillation  Allergies  Allergen Reactions  . Vicodin [Hydrocodone-Acetaminophen] Nausea And Vomiting    Patient Measurements: Height: 5\' 11"  (180.3 cm) Weight: 176 lb 2.4 oz (79.9 kg) IBW/kg (Calculated) : 75.3 Heparin Dosing Weight: 79.9kg  Vital Signs: Temp: 99.4 F (37.4 C) (12/22 2000) Temp src: Oral (12/22 2000) BP: 91/35 mmHg (12/22 2100) Pulse Rate: 79 (12/22 2100)  Labs:  Recent Labs  04/22/13 0330 04/23/13 0500 04/24/13 0400 04/24/13 1255 04/24/13 2000  HGB 9.5* 8.5* 8.3*  --   --   HCT 29.4* 26.7* 26.6*  --   --   PLT 85* 104* 140*  --   --   APTT  --   --   --  32  --   LABPROT  --   --   --  15.0  --   INR  --   --   --  1.21  --   HEPARINUNFRC  --   --   --   --  0.22*  CREATININE 1.05 1.37* 2.24*  --   --     Estimated Creatinine Clearance: 25.7 ml/min (by C-G formula based on Cr of 2.24).   Medical History: Past Medical History  Diagnosis Date  . Hypertension     Medications:  Scheduled:  . amiodarone (NEXTERONE PREMIX) 360 mg/200 mL dextrose      . antiseptic oral rinse  1 application Mouth Rinse QID  . bisacodyl  10 mg Rectal Daily  . ceFEPime (MAXIPIME) IV  1 g Intravenous Q24H  . chlorhexidine  15 mL Mouth/Throat BID  . feeding supplement (OXEPA)  1,000 mL Per Tube Q24H  . free water  250 mL Per Tube Q6H  . metoCLOPramide (REGLAN) injection  5 mg Intravenous Q6H  . pantoprazole sodium  40 mg Per Tube Daily  . vancomycin  750 mg Intravenous Q24H   Infusions:  . sodium chloride 10 mL/hr at 04/24/13 2100  . amiodarone (NEXTERONE PREMIX) 360 mg/200 mL dextrose 60 mg/hr (04/24/13 1900)  . fentaNYL infusion INTRAVENOUS 100 mcg/hr (04/24/13 1900)  . furosemide (LASIX) infusion 5 mg/hr (04/24/13 1900)  . heparin 1,000 Units/hr (04/24/13 1900)    Assessment: 79 yo male s/p emergent repair of incarcerated femoral hernia on 12/13, post-op  course complicated by respiratory failure, septic shock, acute renal failure and new onset afib. Failed cardioversion, but converted to NSR on amiodarone which was dc'd 12/16. Today, 12/22, back into afib and IV amiodarone resumed. Now Pharmacy is asked to start IV heparin.   Patient received lovenox 40mg  at 10am this morning, therefore will not bolus heparin.  H/H low, Platelets have improved, HIT negative. Baseline apTT/INR are WNL  Scr is elevated 2.24, and trending up.  CrCl 25 ml/min  Heparin level 0.22 is below goal of 0.3.0.7, no issues reported    Goal of Therapy:  Heparin level 0.3-0.7 units/ml Monitor platelets by anticoagulation protocol: Yes   Plan:  1.) Increase heparin gtt to 1200 units/hr (12 ml/hr) 2.) Heparin level 8 hours after change in gtt rate ~ 0600 3.) DHL, CBC 4.) Monitor for s/sx bleeding   Taiz Bickle, Loma Messing PharmD Pager #: [phone] 9:39 PM 04/24/2013

## 2013-04-24 NOTE — Progress Notes (Signed)
ANTIBIOTIC CONSULT NOTE - FOLLOW UP  Pharmacy Consult for Vancomycin, Cefepime Indication: Klebsiella PNA and bacteremia, HCAP  Allergies  Allergen Reactions  . Vicodin [Hydrocodone-Acetaminophen] Nausea And Vomiting    Patient Measurements: Height: 5\' 11"  (180.3 cm) Weight: 176 lb 2.4 oz (79.9 kg) IBW/kg (Calculated) : 75.3  Vital Signs: Temp: 99.5 F (37.5 C) (12/22 0800) Temp src: Oral (12/22 0800) BP: 99/40 mmHg (12/22 0800) Pulse Rate: 92 (12/22 0800) Intake/Output from previous day: 12/21 0701 - 12/22 0700 In: 2490 [I.V.:1175; NG/GT:915; IV Piggyback:400] Out: 1060 [Urine:1060] Intake/Output from this shift: Total I/O In: 40 [I.V.:40] Out: 60 [Urine:60]  Labs:  Recent Labs  04/22/13 0330 04/23/13 0500 04/24/13 0400  WBC 15.7* 13.8* 16.0*  HGB 9.5* 8.5* 8.3*  PLT 85* 104* 140*  CREATININE 1.05 1.37* 2.24*   Estimated Creatinine Clearance: 25.7 ml/min (by C-G formula based on Cr of 2.24).  Recent Labs  04/22/13 2109  VANCOTROUGH 13.9     Microbiology: Recent Results (from the past 720 hour(s))  URINE CULTURE     Status: None   Collection Time    04/03/2013  1:15 PM      Result Value Range Status   Specimen Description URINE, CLEAN CATCH   Final   Special Requests NONE   Final   Culture  Setup Time     Final   Value: 04/19/2013 17:22     Performed at [facility] Count     Final   Value: 7,000 COLONIES/ML     Performed at [facility]   Culture     Final   Value: INSIGNIFICANT GROWTH     Performed at [facility]   Report Status 04/16/2013 FINAL   Final  CULTURE, BLOOD (ROUTINE X 2)     Status: None   Collection Time    04/16/13  9:05 AM      Result Value Range Status   Specimen Description BLOOD LEFT ANTECUBITAL   Final   Special Requests BOTTLES DRAWN AEROBIC AND ANAEROBIC 5CC   Final   Culture  Setup Time     Final   Value: 04/16/2013 17:04     Performed at [facility]   Culture     Final   Value: KLEBSIELLA PNEUMONIAE     Note: Gram Stain Report Called to,Read Back By and Verified With: Donetta Potts 04/21/13 AT 0240 RIDK     Performed at [facility]   Report Status 04/23/2013 FINAL   Final   Organism ID, Bacteria KLEBSIELLA PNEUMONIAE   Final  CULTURE, BLOOD (ROUTINE X 2)     Status: None   Collection Time    04/16/13  9:15 AM      Result Value Range Status   Specimen Description BLOOD RIGHT WRIST   Final   Special Requests BOTTLES DRAWN AEROBIC AND ANAEROBIC 5CC   Final   Culture  Setup Time     Final   Value: 04/16/2013 17:04     Performed at [facility]   Culture     Final   Value: NO GROWTH 5 DAYS     Performed at [facility]   Report Status 04/22/2013 FINAL   Final  CULTURE, RESPIRATORY (NON-EXPECTORATED)     Status: None   Collection Time    04/16/13 10:13 AM      Result Value Range Status   Specimen Description TRACHEAL ASPIRATE   Final   Special Requests Normal  Final   Gram Stain     Final   Value: MODERATE WBC PRESENT, PREDOMINANTLY PMN     RARE SQUAMOUS EPITHELIAL CELLS PRESENT     RARE GRAM NEGATIVE RODS     RARE GRAM POSITIVE RODS     Performed at [facility]   Culture     Final   Value: MODERATE KLEBSIELLA PNEUMONIAE     Note: Two isolates with different morphologies were identified as the same organism.The most resistant organism was reported.     Performed at [facility]   Report Status 04/19/2013 FINAL   Final   Organism ID, Bacteria KLEBSIELLA PNEUMONIAE   Final  CULTURE, BAL-QUANTITATIVE     Status: None   Collection Time    04/20/13  9:47 AM      Result Value Range Status   Specimen Description BRONCHIAL ALVEOLAR LAVAGE   Final   Special Requests NONE   Final   Gram Stain     Final   Value: MODERATE WBC PRESENT, PREDOMINANTLY PMN     NO SQUAMOUS EPITHELIAL CELLS SEEN     NO ORGANISMS SEEN     Performed at [facility] Count     Final   Value: 5,000 COLONIES/ML      Performed at [facility]   Culture     Final   Value: KLEBSIELLA PNEUMONIAE     Note: Two isolates with different morphologies were identified as the same organism.The most resistant organism was reported.     Performed at [facility]   Report Status 04/23/2013 FINAL   Final   Organism ID, Bacteria KLEBSIELLA PNEUMONIAE   Final  VIRAL CULTURE VIRC     Status: None   Collection Time    04/20/13  9:47 AM      Result Value Range Status   Specimen Description BRONCHIAL ALVEOLAR LAVAGE   Final   Special Requests NONE   Final   Culture     Final   Value: Culture has been initiated.     Note:    The current Enterovirus culture system does not detect Enterovirus D68. If Enterovirus D68 is suspected, please call client services to add the test for Enterovirus PCR (Test code 16109).     Performed at [facility]   Report Status PENDING   Incomplete  RESPIRATORY VIRUS PANEL     Status: None   Collection Time    04/20/13  9:47 AM      Result Value Range Status   Source - RVPAN BRONCHIAL ALVEOLAR LAVAGE   Corrected   Comment: CORRECTED ON 12/21 AT 0151: PREVIOUSLY REPORTED AS BRONCHIAL ALVEOLAR LAVAGE   Respiratory Syncytial Virus A NOT DETECTED   Final   Respiratory Syncytial Virus B NOT DETECTED   Final   Influenza A NOT DETECTED   Final   Influenza B NOT DETECTED   Final   Parainfluenza 1 NOT DETECTED   Final   Parainfluenza 2 NOT DETECTED   Final   Parainfluenza 3 NOT DETECTED   Final   Metapneumovirus NOT DETECTED   Final   Rhinovirus NOT DETECTED   Final   Adenovirus NOT DETECTED   Final   Influenza A H1 NOT DETECTED   Final   Influenza A H3 NOT DETECTED   Final   Comment: (NOTE)           Normal Reference Range for each Analyte: NOT DETECTED     Testing  performed using the Luminex xTAG Respiratory Viral Panel test     kit.     This test was developed and its performance characteristics determined     by [facility]. It has not been cleared or  approved by the [place]     Food and Drug Administration. This test is used for clinical purposes.     It should not be regarded as investigational or for research. This     laboratory is certified under the Clinical Laboratory Improvement     Amendments of 1988 (CLIA) as qualified to perform high complexity     clinical laboratory testing.     Performed at [facility]    Anti-infectives   Start     Dose/Rate Route Frequency Ordered Stop   04/23/13 1100  ceFEPIme (MAXIPIME) 1 g in dextrose 5 % 50 mL IVPB     1 g 100 mL/hr over 30 Minutes Intravenous Every 12 hours 04/23/13 1014     04/20/13 2200  vancomycin (VANCOCIN) IVPB 750 mg/150 ml premix     750 mg 150 mL/hr over 60 Minutes Intravenous Every 12 hours 04/20/13 0903     04/20/13 1200  imipenem-cilastatin (PRIMAXIN) 500 mg in sodium chloride 0.9 % 100 mL IVPB  Status:  Discontinued     500 mg 200 mL/hr over 30 Minutes Intravenous Every 8 hours 04/20/13 1009 04/23/13 0952   04/20/13 0930  vancomycin (VANCOCIN) IVPB 1000 mg/200 mL premix     1,000 mg 200 mL/hr over 60 Minutes Intravenous  Once 04/20/13 0903 04/20/13 1100   04/16/13 0600  vancomycin (VANCOCIN) IVPB 750 mg/150 ml premix  Status:  Discontinued     750 mg 150 mL/hr over 60 Minutes Intravenous Every 12 hours 04/24/2013 1400 04/17/13 0858   04/11/2013 2200  piperacillin-tazobactam (ZOSYN) IVPB 3.375 g  Status:  Discontinued     3.375 g 12.5 mL/hr over 240 Minutes Intravenous 3 times per day 04/28/2013 1400 04/20/13 0947   04/19/2013 1530  vancomycin (VANCOCIN) 1,406 mg in sodium chloride 0.9 % 500 mL IVPB  Status:  Discontinued     20 mg/kg  70.3 kg 250 mL/hr over 120 Minutes Intravenous  Once 04/29/2013 1525 04/13/2013 1525   04/11/2013 1530  piperacillin-tazobactam (ZOSYN) IVPB 3.375 g  Status:  Discontinued     3.375 g 100 mL/hr over 30 Minutes Intravenous  Once 04/29/2013 1525 04/24/2013 1525   04/11/2013 1430  [MAR Hold]  vancomycin (VANCOCIN) IVPB 1000 mg/200 mL premix     (On  MAR Hold since 05/03/2013 1630)   1,000 mg 200 mL/hr over 60 Minutes Intravenous STAT 04/03/2013 1358 05/01/2013 1632   04/20/2013 1430  piperacillin-tazobactam (ZOSYN) IVPB 3.375 g     3.375 g 100 mL/hr over 30 Minutes Intravenous STAT 04/27/2013 1358 04/16/2013 1514      Anti-infectives: 12/13 >> Zosyn >> 12/18 12/13 >> Vanc >> 12/15, resume 12/18 >>  12/18 >> Imipenem >> 12/21 12/21 >> Cefepime >>  Assessment: 79[facility] hernia s/p repair 12/13 and admitted to the ICU on ventilator. CXR on admission with possible PNA, V/Z started then narrowed down to Zosyn alone given likely aspiration PNA. Replaced Zosyn with Primaxin 12/18 for HCAP. Moderate klebsiella pneumoniae was cultured from sputum. 12/21, pt spiked a fever - added back vancomycin, Later, Primaxin d/c'd and changed to Cefepime due to continued fevers on Primaxin.  Day #10 total antibiotics, currently D5 Vancomycin, D2 Cefepime  Tmax: 102.2 last night,  Tc 99.5 WBC: remain elevated and rising Renal: SCr acutely rising, now 2.24 with CrCl 25 ml/min. UOP decreasing.  Goal of Therapy:  Vancomycin trough level 15-20 mcg/ml Cefepime dose per renal function  Plan:   Decrease Vancomycin to 750mg  IV q24h - next dose tonight 22:00  Decrease Cefepime to 1g IV q24h - next dose tonight 22:00  Monitor renal function closely  Follow up cultures, duration of therapy  Loralee Pacas, PharmD, BCPS Pager: [phone] 04/24/2013,9:10 AM

## 2013-04-24 NOTE — Progress Notes (Signed)
00:00 Patient had an axillary temp of 102.2, MD notified.

## 2013-04-24 NOTE — Progress Notes (Signed)
Name: Casey Davila MRN: [id number] DOB: 03/28/1926    ADMISSION DATE:  04/26/2013 CONSULTATION DATE:  04/24/2013  REFERRING MD :  Romie Levee, MD PRIMARY SERVICE: Surgery  CHIEF COMPLAINT:   Post repair of incarcerated femoral hernia. Out of the OR intubated.   BRIEF PATIENT DESCRIPTION:  79 years old male with PMH relevant for HTN.  Underwent emergent repair of incarcerated hernia and now out of the OR intubated in the ICU. CCM consult called to assist with ventilator management. . Admitted 05/01/2013 10:12 AM  LINES / TUBES: 12/14 L IJ CVL >> 12/13 ETT >>  CULTURES: 12/13 - Urine - nega 12/14 resp: pan sens Klebsiella PNA  12/14 blood >> Klebsiella ................................ 12/18 BAL>>>GNR 04/20/13 - BAL Resp Virus PCR - NEGATIVE  ANTIBIOTICS: 12/13 vanc >>12/15 12/13 zosyn >>12/18 .................... 12/18 primaxin>>>12/21 12/18 vanc>>> .......... 04/23/13 - cefepime   SIGNIFICANT EVENTS / STUDIES:  12/14 CT angio chest/ab/pelv> esophagus full of fluid? (thick), nearly occludes left mainstem with external compression; left lower lung pulm nodule; aspiration pneumonitis bilateral base, SBO, R femoral hernia 12/14 afib with rvr/shock, attempted cardioversion x3 unsuccessful 12/18 FOB, copious thick secretions 04/22/13 - 70% fio2/10 peep. Agitated on WUA.  Not making progress. Stil febrile 12/22 a-fib with RVR, amiodarone started.  SUBJECTIVE/OVERNIGHT/INTERVAL HX A-fib with RVR this AM but controllable with amiodarone.  VITAL SIGNS: Temp:  [99.4 F (37.4 C)-102.7 F (39.3 C)] 99.5 F (37.5 C) (12/22 0800) Pulse Rate:  [90-111] 104 (12/22 1100) Resp:  [19-26] 21 (12/22 1100) BP: (92-138)/(34-54) 122/46 mmHg (12/22 1100) SpO2:  [90 %-100 %] 90 % (12/22 1100) FiO2 (%):  [40 %-60 %] 40 % (12/22 0913) Weight:  [79.9 kg (176 lb 2.4 oz)] 79.9 kg (176 lb 2.4 oz) (12/22 0245)  HEMODYNAMICS: CVP:  [3 mmHg-11 mmHg] 11 mmHg  VENTILATOR SETTINGS: Vent Mode:   [-] PRVC FiO2 (%):  [40 %-60 %] 40 % Set Rate:  [18 bmp] 18 bmp Vt Set:  [450 mL] 450 mL PEEP:  [10 cmH20] 10 cmH20 Plateau Pressure:  [13 cmH20-22 cmH20] 13 cmH20  INTAKE / OUTPUT: Intake/Output     12/21 0701 - 12/22 0700 12/22 0701 - 12/23 0700   I.V. (mL/kg) 1175 (14.7) 192.5 (2.4)   NG/GT 915 60   IV Piggyback 400    Total Intake(mL/kg) 2490 (31.2) 252.5 (3.2)   Urine (mL/kg/hr) 1060 (0.6) 385 (1.1)   Total Output 1060 385   Net +1430 -132.5        Stool Occurrence 2 x 1 x    PHYSICAL EXAMINATION: General: comfortable on vent HEENT: NCAT, ETT in place PULM: Decreased t/o CV: RRR, no mgr, NSR on tele  AB: BS noted, non-tender Ext: warm, no edema, SCD's Neuro: arouses easily, follows commands  LABS: PULMONARY  Recent Labs Lab 04/18/13 0358 04/19/13 0342 04/23/13 0412  PHART 7.329* 7.360 7.277*  PCO2ART 40.4 46.4* 70.8*  PO2ART 61.3* 71.3* 88.4  HCO3 20.7 25.4* 31.3*  TCO2 19.3 23.4 29.9  O2SAT 89.1 92.9 95.2   CBC  Recent Labs Lab 04/22/13 0330 04/23/13 0500 04/24/13 0400  HGB 9.5* 8.5* 8.3*  HCT 29.4* 26.7* 26.6*  WBC 15.7* 13.8* 16.0*  PLT 85* 104* 140*   COAGULATION No results found for this basename: INR,  in the last 168 hours  CARDIAC  No results found for this basename: TROPONINI,  in the last 168 hours  Recent Labs Lab 04/23/13 0500  PROBNP 821.7*   CHEMISTRY  Recent Labs Lab 04/18/13 0545 04/18/13  1448  04/20/13 1405 04/21/13 0500 04/22/13 0330 04/23/13 0500 04/24/13 0400  NA 137 140  < > 150* 152* 148* 154* 150*  K 3.6 3.9  < > 3.8 3.9 4.0 4.5 4.6  CL 106 108  < > 113* 116* 112 118* 112  CO2 21 23  < > 30 30 30  32 30  GLUCOSE 97 86  < > 147* 146* 130* 132* 119*  BUN 39* 38*  < > 48* 52* 51* 58* 83*  CREATININE 0.87 0.94  < > 1.09 1.04 1.05 1.37* 2.24*  CALCIUM 8.2* 8.4  < > 8.8 8.8 9.0 9.0 8.7  MG  --  2.2  --  2.0  --   --  2.2 2.4  PHOS  --   --   --   --   --   --  3.4 5.1*  < > = values in this interval not  displayed. Estimated Creatinine Clearance: 25.7 ml/min (by C-G formula based on Cr of 2.24).  LIVER  Recent Labs Lab 04/19/13 0427 04/21/13 0500 04/22/13 0330  AST 21 24 34  ALT 13 14 18   ALKPHOS 94 111 115  BILITOT 1.2 3.0* 2.9*  PROT 5.3* 5.1* 4.9*  ALBUMIN 1.8* 1.6* 1.4*   INFECTIOUS  Recent Labs Lab 04/20/13 0400 04/21/13 0500 04/22/13 0330 04/24/13 0545  LATICACIDVEN  --   --   --  1.0  PROCALCITON 4.44 2.75 1.90  --    ENDOCRINE CBG (last 3)   Recent Labs  04/23/13 2351 04/24/13 0325 04/24/13 0735  GLUCAP 124* 104* 108*   IMAGING x48h  Ct Chest Wo Contrast  04/23/2013   CLINICAL DATA:  Ventilated patient. Status post repair of incarcerated femoral hernia 04/08/2013. Sepsis and pneumonia.  EXAM: CT CHEST WITHOUT CONTRAST  TECHNIQUE: Multidetector CT imaging of the chest was performed following the standard protocol without IV contrast.  COMPARISON:  CT chest 04/24/2013. Plain films of the chest 12 19/2014, 04/22/2013 and 04/23/2013.  FINDINGS: The patient has small bilateral pleural effusions. No pericardial effusion is identified. Heart size is normal. Endotracheal tube is in place with the tip well above the carina. NG tube courses into the stomach. There is no axillary, hilar or mediastinal lymphadenopathy. Fluid is identified within the esophagus but distention of the esophagus seen on the prior study is markedly improved. Compression of the left mainstem bronchus has nearly completely resolved.  The lungs demonstrate extensive bilateral airspace disease, worse on the right, where it is panlobar. There is relative sparing of the left upper lobe. Consolidative change appears worst in the lung bases and on the right. Airspace disease has markedly progressed since the prior CT scan. 1.1 cm left lower lobe nodule seen on the prior study is not visualized today and could be obscured by pneumonia.  Visualized upper abdomen is unremarkable. No lytic or sclerotic bony  lesion is seen.  IMPRESSION: Marked progression of extensive bilateral airspace disease, worse on the right, consistent with pneumonia since the patient's prior CT scan.  Interval decrease in distention of the esophagus. Marked mass effect on left main stem bronchus seen on the prior CT is nearly completely resolved. There is some fluid within the esophagus as on the prior study.  Small bilateral pleural effusions, larger on the right.   Electronically Signed   By: Drusilla Kanner M.D.   On: 04/23/2013 17:34   Dg Chest Port 1 View  04/24/2013   CLINICAL DATA:  Intubation.  EXAM: PORTABLE CHEST - 1  VIEW  COMPARISON:  CT chest 04/23/2013.  Chest x-ray 04/23/2013.  FINDINGS: Endotracheal tube and left IJ line in stable position. NG tube has been withdrawn, as tip is in the distal esophagus. Persistent bilateral pulmonary infiltrates are again noted particularly throughout the right lung. Associated atelectatic changes noted. Right pleural effusion is noted. Left hemidiaphragm is incompletely imaged. Heart size is normal. No pneumothorax.  IMPRESSION: 1. NG tube is with been withdrawn slightly and is in the distal esophagus. 2. Persistent bilateral pulmonary infiltrates particularly throughout the right lung. There is persistent atelectatic changes. Persistent right pleural effusion. This report was phoned to the patient's Nurse Judeth Cornfield at 7:24 a.m. on 04/24/2013 .   Electronically Signed   By: Maisie Fus  Register   On: 04/24/2013 07:24   Dg Chest Port 1 View  04/23/2013   CLINICAL DATA:  Respiratory failure  EXAM: PORTABLE CHEST - 1 VIEW  COMPARISON:  04/22/2013  FINDINGS: Endotracheal tube terminates 6 cm above the carina.  Multifocal patchy opacities in the right lung and left lung base, suspicious for pneumonia. Asymmetric interstitial edema is considered unlikely. No pleural effusion or pneumothorax.  The heart is normal in size.  Stable left IJ venous catheter terminating in the mid SVC. Enteric tube  courses into the stomach.  IMPRESSION: Endotracheal tube terminates 6 cm above the carina.  Multifocal patchy opacities, right lung predominant, suspicious for pneumonia.   Electronically Signed   By: Charline Bills M.D.   On: 04/23/2013 10:38   Dg Chest Port 1 View  04/22/2013   CLINICAL DATA:  Repositioning of endotracheal tube.  EXAM: PORTABLE CHEST - 1 VIEW  COMPARISON:  Chest x-ray of April 22, 2013.  FINDINGS: The endotracheal tube tip lies approximately 1 cm below the inferior margin of the clavicular heads and approximately 5 cm above the crotch of the carina. The lungs are adequately inflated. The interstitial markings remain increased especially on the right where they become nearly confluent in the infrahilar region. The retrocardiac region on the left is perhaps slightly less dense than on the earlier study. The cardiopericardial silhouette is not enlarged. The pulmonary vascularity is indistinct. There is no significant pleural effusion. The esophagogastric tube tip and proximal port project below the inferior margin of the film. The right internal jugular venous catheter tip lies in the region of proximal SVC.  IMPRESSION: 1. The endotracheal tube appears to be in reasonable position. The other support tubes and lines appear stable. 2. There is persistent abnormally increased interstitial density in both lungs especially on the right in the infrahilar region consistent with asymmetric pulmonary edema or other alveolar filling processes. .   Electronically Signed   By: David  [place]   On: 04/22/2013 11:32   Dg Abd Portable 1v  04/24/2013   CLINICAL DATA:  Nasogastric tube placement at bedside.  EXAM: PORTABLE ABDOMEN - 1 VIEW  COMPARISON:  04/19/2013.  FINDINGS: Nasogastric tube tip overlies proximal body of the stomach. Bowel gas pattern unremarkable without evidence of obstruction or significant ileus. Large stool burden in the colon.  IMPRESSION: 1. Nasogastric tube tip overlies the  proximal body of the stomach. 2. No acute abdominal abnormality.  Large stool burden.   Electronically Signed   By: Hulan Saas M.D.   On: 04/24/2013 08:49   ASSESSMENT / PLAN:  PULMONARY A: 1) Acute hypoxemic respiratory failure due to aspiration pneumonitis/ ARDS   04/22/13  - Persistent ARDS. Net 3L positive past 7 days 12./21/14 - Improved to 50% fio2/peep  10 12/22 improved PEEP 5 and FiO2 of 40%. P:   - Full vent support, decrease PEEP to 5 and titrate O2 for sats. - Cont TVol @ 6cc/kg. - See ID sectional. - ABG now. - CXR and ABG in AM. - Diureses as ordered.  CARDIOVASCULAR A:  1) Septic shock > resolved 2) New onset afib with RVR due to sepsis and levophed, failed cardioversion; converted with amiodarone, Now NSR w/ occ PAC, had Run of SVT 12/19 04/23/13 - sinus tach  P:  - PRN lopressor. - KVO IVFs. - Heparin drip per pharmacy and will need coumadin as outpatient.  RENAL A:   Acute renal failure> resolved Hypernatremia s/p diuresis , and worsening Also 3rd spacing  P:   - Free water (gut better route, residuals are < 400cc) 250 ml q6 hours. - F/U chem. - Lasix drip as ordered for 24hours. - Replace electrolytes as needed.  GASTROINTESTINAL A:   1) R femoral hernia and SBO, viable bowel; Status post surgical repair  04/23/13 - residuals + but < 400cc P:   - GI prophylaxis with protonix. - Advance TF as tolerated.  HEMATOLOGIC A:   ANemia of critical illness Mild thrombocytopenia (dropped 50%) due to illness: Plts holding in the 70-80 range. Changed to LMWH on 12/16.  HTT negative 12/16//14  P:  - PRBC for hgb </= 6.9gm%    - exceptions are   -  if ACS susepcted/confirmed then transfuse for hgb </= 8.0gm%,  or    - active bleeding with hemodynamic instability, then transfuse regardless of hemoglobin value   At at all times try to transfuse 1 unit prbc as possible with exception of active hemorrhage  INFECTIOUS A:   1) Aspiration PNA +  Klebsiella PNA (Pan sens)   04/23/13  - fever curve worse despite change to primaxin on 04/20/13  P:   - Changed primaxin to cefepime. - Continue vanc. - See above, f/u culture data narrow when able  - Hold off starting anti-fungal for now.  ENDOCRINE A:   1) No issues    NEUROLOGIC A:   1) Intubated, sedated  04/23/13 - Agiated on WUA  P:   - Sedation and pain management with fentanyl - RASS score goal of -1  Global:   Spoke with patient's wife extensively.  Patient is clearly not improving.  After discussion with wife decision was made make full NCB and withdrawal of care towards the end of the week.  The patient is critically ill with multiple organ systems failure and requires high complexity decision making for assessment and support, frequent evaluation and titration of therapies, application of advanced monitoring technologies and extensive interpretation of multiple databases.   Critical Care Time devoted to patient care services described in this note is 35 minutes.  Alyson Reedy, M.D. [facility] Pulmonary/Critical Care Medicine. Pager: [phone]. After hours pager: [phone].

## 2013-04-24 NOTE — Progress Notes (Signed)
NUTRITION FOLLOW UP  Intervention:   - Due to pt with ARDS, will change TF formula to Oxepa and leave at 43ml/hr per plan for trickle TF. Recommend goal rate of Oxepa at 100ml/hr with Prostat 30ml daily which will provide 1900 calories, 90g protein, and meet 97% estimated calorie needs, 95% estimated protein needs - Will continue to monitor   Nutrition Dx:   Inadequate oral intake related to inability to eat as evidenced by NPO - ongoing   Goal:   Initiation of TF with goal to meet >90% of estimated nutritional needs - TF initiation met, not at goal rate yet  Monitor:   Weights, labs, vent status, TF initiation/tolerance/advancement, BM  Assessment:   Admitted with abdominal pain and constipation x 3 days with nausea. Found to have small bowel obstruction with incarcerated femoral hernia which was surgically repaired 04/18/2013. During intubation for surgery he was found to have gastric content in his vocal cords. Had cardioversion yesterday for new onset A-fib with RVR in the setting of shock.   12/15 - Surgery following, noted plans for possible TF tomorrow. Pt with NGT in place with recent canister change, had some brown bilious fluid overnight with documented total output yesterday. Noted pt's weight up 20 pounds since admission and pt with +1 lower extremity edema.   12/17 - Per surgery's notes, pt's ileus seems to be resolving, pt passing flatus with minimal NGT output. Plan is to start trickle TF but to be advanced only per surgery. Pt's weight up 13 pounds since admission.   12/18 - Pt tolerated trickle TF with no residuals per surgery notes and per conversation with RN. Per surgery, small bowel obstruction has resolved and post-op ileus is resolving. Met with pt's wife in room, discussed nutrition plan. Wife reports pt has never been sick before. Emotional support provided.   12/19 - TF residual of , TF held at Vital AF 1.2 at 18ml/hr (goal rate of 25ml/hr).   12/20 -  Reglan added by MD to help gastric emptying. Pt with persistent ARDS per MD notes.   12/21 - Pt noted to have persistent gray/green secretions not c/w TFs per MD notes. Pt with TF residual of 671ml/hr, TF put on hold. Had 2 BMs last night.   12/22 - Plan is to restart TF today at trickle TF rate. Pt's weight up 21 pounds since admission.   Patient is currently intubated on ventilator support.  MV: 8.2 L/min Temp (24hrs), Avg:100.7 F (38.2 C), Min:99.4 F (37.4 C), Max:102.7 F (39.3 C)  Propofol: off   Current TF: Vital AF 1.2 at 106ml/hr - provides 576 calories, 36g protein, free water which meets 29% of estimated calorie needs, 38% estimated protein needs  Sodium elevated BUN/Cr elevated with low GFR Total bilirubin elevated  Height: Ht Readings from Last 1 Encounters:  04/22/2013 5\' 11"  (1.803 m)    Weight Status:   Wt Readings from Last 1 Encounters:  04/24/13 176 lb 2.4 oz (79.9 kg)  Admit wt:        155 lb (70.3 kg)  Net I/Os: +8.2L  Re-estimated needs:  Kcal: 1964 Protein: 95-120g Fluid: >1.9L/day  Skin: +1 generalized edema, abdominal incision   Diet Order: NPO   Intake/Output Summary (Last 24 hours) at 04/24/13 1114 Last data filed at 04/24/13 1100  Gross per 24 hour  Intake 2032.5 ml  Output   1370 ml  Net  662.5 ml    Last BM: 12/21   Labs:  Recent Labs Lab 04/20/13 1405  04/22/13 0330 04/23/13 0500 04/24/13 0400  NA 150*  < > 148* 154* 150*  K 3.8  < > 4.0 4.5 4.6  CL 113*  < > 112 118* 112  CO2 30  < > 30 32 30  BUN 48*  < > 51* 58* 83*  CREATININE 1.09  < > 1.05 1.37* 2.24*  CALCIUM 8.8  < > 9.0 9.0 8.7  MG 2.0  --   --  2.2 2.4  PHOS  --   --   --  3.4 5.1*  GLUCOSE 147*  < > 130* 132* 119*  < > = values in this interval not displayed.  CBG (last 3)   Recent Labs  04/23/13 2351 04/24/13 0325 04/24/13 0735  GLUCAP 124* 104* 108*    Scheduled Meds: . antiseptic oral rinse  1 application Mouth Rinse QID  .  bisacodyl  10 mg Rectal Daily  . ceFEPime (MAXIPIME) IV  1 g Intravenous Q24H  . chlorhexidine  15 mL Mouth/Throat BID  . enoxaparin (LOVENOX) injection  40 mg Subcutaneous Daily  . feeding supplement (VITAL AF 1.2 CAL)  1,000 mL Per Tube Q24H  . free water  100 mL Per Tube Q2H  . furosemide  40 mg Intravenous BID  . metoCLOPramide (REGLAN) injection  5 mg Intravenous Q6H  . pantoprazole sodium  40 mg Per Tube Daily  . vancomycin  750 mg Intravenous Q24H    Continuous Infusions: . sodium chloride 20 mL/hr (04/23/13 0619)  . fentaNYL infusion INTRAVENOUS 100 mcg/hr (04/24/13 1100)    Levon Hedger MS, RD, LDN [phone] Pager [phone] After Hours Pager

## 2013-04-24 NOTE — Progress Notes (Signed)
ANTICOAGULATION CONSULT NOTE - Initial Consult  Pharmacy Consult for Heparin Indication: atrial fibrillation  Allergies  Allergen Reactions  . Vicodin [Hydrocodone-Acetaminophen] Nausea And Vomiting    Patient Measurements: Height: 5\' 11"  (180.3 cm) Weight: 176 lb 2.4 oz (79.9 kg) IBW/kg (Calculated) : 75.3 Heparin Dosing Weight: 79.9kg  Vital Signs: Temp: 99.5 F (37.5 C) (12/22 0800) Temp src: Oral (12/22 0800) BP: 100/39 mmHg (12/22 1200) Pulse Rate: 95 (12/22 1200)  Labs:  Recent Labs  04/22/13 0330 04/23/13 0500 04/24/13 0400  HGB 9.5* 8.5* 8.3*  HCT 29.4* 26.7* 26.6*  PLT 85* 104* 140*  CREATININE 1.05 1.37* 2.24*    Estimated Creatinine Clearance: 25.7 ml/min (by C-G formula based on Cr of 2.24).   Medical History: Past Medical History  Diagnosis Date  . Hypertension     Medications:  Scheduled:  . amiodarone (NEXTERONE PREMIX) 360 mg/200 mL dextrose      . antiseptic oral rinse  1 application Mouth Rinse QID  . bisacodyl  10 mg Rectal Daily  . ceFEPime (MAXIPIME) IV  1 g Intravenous Q24H  . chlorhexidine  15 mL Mouth/Throat BID  . feeding supplement (OXEPA)  1,000 mL Per Tube Q24H  . free water  250 mL Per Tube Q6H  . metoCLOPramide (REGLAN) injection  5 mg Intravenous Q6H  . pantoprazole sodium  40 mg Per Tube Daily  . vancomycin  750 mg Intravenous Q24H   Infusions:  . sodium chloride 20 mL/hr (04/23/13 0619)  . amiodarone (NEXTERONE PREMIX) 360 mg/200 mL dextrose 60 mg/hr (04/24/13 1200)   Followed by  . amiodarone (NEXTERONE PREMIX) 360 mg/200 mL dextrose    . fentaNYL infusion INTRAVENOUS 100 mcg/hr (04/24/13 1200)  . furosemide (LASIX) infusion      Assessment: 79 yo male s/p emergent repair of incarcerated femoral hernia on 12/13, post-op course complicated by respiratory failure, septic shock, acute renal failure and new onset afib. Failed cardioversion, but converted to NSR on amiodarone which was dc'd 12/16. Today, 12/22, back into  afib and IV amiodarone resumed. Now Pharmacy is asked to start IV heparin.   Patient received lovenox 40mg  at 10am this morning, therefore will not bolus heparin.  H/H low, Platelets have improved, HIT negative. No bleeding reported.  Baseline pTT, PT/INR pending   Goal of Therapy:  Heparin level 0.3-0.7 units/ml Monitor platelets by anticoagulation protocol: Yes   Plan:   Begin IV Heparin 1000 units/hr (~12 units/kg/hr)  Check heparin level in 8hrs  Daily heparin level and CBC  Loralee Pacas, PharmD, BCPS Pager: [phone] 04/24/2013,12:12 PM

## 2013-04-24 NOTE — Progress Notes (Signed)
Patient on Amiodarone gtt and HR sustaining >120s AFib/ST with PACs.  ELink MD notified.  New orders obtained, will continue to monitor.

## 2013-04-24 NOTE — Progress Notes (Signed)
Notified by radiologist that patient's OG tube was in the distal esophagus and needed to be advanced.  Advanced tube approximately 12 inches, and confirmed placement by air and aspiration of gastric contents.  OG tube clamped at this time.  Will continue to monitor.

## 2013-04-24 NOTE — Progress Notes (Signed)
Patient ID: Casey Davila, male   DOB: [birth year], 79 y.o.   MRN: [id number] 9 Days Post-Op  Subjective: RN reports patient does not really follow commands when he is waking up.  This would be different than last week.  Had 2 BMs over night, but also had greater than 650cc residual last night as well.  Objective: Vital signs in last 24 hours: Temp:  [99.4 F (37.4 C)-102.7 F (39.3 C)] 99.4 F (37.4 C) (12/22 0400) Pulse Rate:  [90-111] 92 (12/22 0800) Resp:  [18-26] 23 (12/22 0800) BP: (92-136)/(34-53) 99/40 mmHg (12/22 0800) SpO2:  [92 %-99 %] 97 % (12/22 0800) FiO2 (%):  [50 %-60 %] 60 % (12/22 0800) Weight:  [176 lb 2.4 oz (79.9 kg)] 176 lb 2.4 oz (79.9 kg) (12/22 0245) Last BM Date: 04/23/13  Intake/Output from previous day: 12/21 0701 - 12/22 0700 In: 2490 [I.V.:1175; NG/GT:915; IV Piggyback:400] Out: 1060 [Urine:1060] Intake/Output this shift: Total I/O In: 40 [I.V.:40] Out: 60 [Urine:60]  PE: Abd: soft, +BS, ND, but diffuse anasarca noted, incision c/d/iw with steri-strips  Lab Results:   Recent Labs  04/23/13 0500 04/24/13 0400  WBC 13.8* 16.0*  HGB 8.5* 8.3*  HCT 26.7* 26.6*  PLT 104* 140*   BMET  Recent Labs  04/23/13 0500 04/24/13 0400  NA 154* 150*  K 4.5 4.6  CL 118* 112  CO2 32 30  GLUCOSE 132* 119*  BUN 58* 83*  CREATININE 1.37* 2.24*  CALCIUM 9.0 8.7   PT/INR No results found for this basename: LABPROT, INR,  in the last 72 hours CMP     Component Value Date/Time   NA 150* 04/24/2013 0400   K 4.6 04/24/2013 0400   CL 112 04/24/2013 0400   CO2 30 04/24/2013 0400   GLUCOSE 119* 04/24/2013 0400   BUN 83* 04/24/2013 0400   CREATININE 2.24* 04/24/2013 0400   CALCIUM 8.7 04/24/2013 0400   PROT 4.9* 04/22/2013 0330   ALBUMIN 1.4* 04/22/2013 0330   AST 34 04/22/2013 0330   ALT 18 04/22/2013 0330   ALKPHOS 115 04/22/2013 0330   BILITOT 2.9* 04/22/2013 0330   GFRNONAA 25* 04/24/2013 0400   GFRAA 29* 04/24/2013 0400   Lipase  No  results found for this basename: lipase       Studies/Results: Ct Chest Wo Contrast  04/23/2013   CLINICAL DATA:  Ventilated patient. Status post repair of incarcerated femoral hernia 04/05/2013. Sepsis and pneumonia.  EXAM: CT CHEST WITHOUT CONTRAST  TECHNIQUE: Multidetector CT imaging of the chest was performed following the standard protocol without IV contrast.  COMPARISON:  CT chest 04/18/2013. Plain films of the chest 12 19/2014, 04/22/2013 and 04/23/2013.  FINDINGS: The patient has small bilateral pleural effusions. No pericardial effusion is identified. Heart size is normal. Endotracheal tube is in place with the tip well above the carina. NG tube courses into the stomach. There is no axillary, hilar or mediastinal lymphadenopathy. Fluid is identified within the esophagus but distention of the esophagus seen on the prior study is markedly improved. Compression of the left mainstem bronchus has nearly completely resolved.  The lungs demonstrate extensive bilateral airspace disease, worse on the right, where it is panlobar. There is relative sparing of the left upper lobe. Consolidative change appears worst in the lung bases and on the right. Airspace disease has markedly progressed since the prior CT scan. 1.1 cm left lower lobe nodule seen on the prior study is not visualized today and could be obscured by pneumonia.  Visualized upper abdomen is unremarkable. No lytic or sclerotic bony lesion is seen.  IMPRESSION: Marked progression of extensive bilateral airspace disease, worse on the right, consistent with pneumonia since the patient's prior CT scan.  Interval decrease in distention of the esophagus. Marked mass effect on left main stem bronchus seen on the prior CT is nearly completely resolved. There is some fluid within the esophagus as on the prior study.  Small bilateral pleural effusions, larger on the right.   Electronically Signed   By: Drusilla Kanner M.D.   On: 04/23/2013 17:34   Dg  Chest Port 1 View  04/24/2013   CLINICAL DATA:  Intubation.  EXAM: PORTABLE CHEST - 1 VIEW  COMPARISON:  CT chest 04/23/2013.  Chest x-ray 04/23/2013.  FINDINGS: Endotracheal tube and left IJ line in stable position. NG tube has been withdrawn, as tip is in the distal esophagus. Persistent bilateral pulmonary infiltrates are again noted particularly throughout the right lung. Associated atelectatic changes noted. Right pleural effusion is noted. Left hemidiaphragm is incompletely imaged. Heart size is normal. No pneumothorax.  IMPRESSION: 1. NG tube is with been withdrawn slightly and is in the distal esophagus. 2. Persistent bilateral pulmonary infiltrates particularly throughout the right lung. There is persistent atelectatic changes. Persistent right pleural effusion. This report was phoned to the patient's Nurse Judeth Cornfield at 7:24 a.m. on 04/24/2013 .   Electronically Signed   By: Maisie Fus  Register   On: 04/24/2013 07:24   Dg Chest Port 1 View  04/23/2013   CLINICAL DATA:  Respiratory failure  EXAM: PORTABLE CHEST - 1 VIEW  COMPARISON:  04/22/2013  FINDINGS: Endotracheal tube terminates 6 cm above the carina.  Multifocal patchy opacities in the right lung and left lung base, suspicious for pneumonia. Asymmetric interstitial edema is considered unlikely. No pleural effusion or pneumothorax.  The heart is normal in size.  Stable left IJ venous catheter terminating in the mid SVC. Enteric tube courses into the stomach.  IMPRESSION: Endotracheal tube terminates 6 cm above the carina.  Multifocal patchy opacities, right lung predominant, suspicious for pneumonia.   Electronically Signed   By: Charline Bills M.D.   On: 04/23/2013 10:38   Dg Chest Port 1 View  04/22/2013   CLINICAL DATA:  Repositioning of endotracheal tube.  EXAM: PORTABLE CHEST - 1 VIEW  COMPARISON:  Chest x-ray of April 22, 2013.  FINDINGS: The endotracheal tube tip lies approximately 1 cm below the inferior margin of the clavicular  heads and approximately 5 cm above the crotch of the carina. The lungs are adequately inflated. The interstitial markings remain increased especially on the right where they become nearly confluent in the infrahilar region. The retrocardiac region on the left is perhaps slightly less dense than on the earlier study. The cardiopericardial silhouette is not enlarged. The pulmonary vascularity is indistinct. There is no significant pleural effusion. The esophagogastric tube tip and proximal port project below the inferior margin of the film. The right internal jugular venous catheter tip lies in the region of proximal SVC.  IMPRESSION: 1. The endotracheal tube appears to be in reasonable position. The other support tubes and lines appear stable. 2. There is persistent abnormally increased interstitial density in both lungs especially on the right in the infrahilar region consistent with asymmetric pulmonary edema or other alveolar filling processes. .   Electronically Signed   By: Riyan Gavina  [place]   On: 04/22/2013 11:32    Anti-infectives: Anti-infectives   Start     Dose/Rate Route  Frequency Ordered Stop   04/23/13 1100  ceFEPIme (MAXIPIME) 1 g in dextrose 5 % 50 mL IVPB     1 g 100 mL/hr over 30 Minutes Intravenous Every 12 hours 04/23/13 1014     04/20/13 2200  vancomycin (VANCOCIN) IVPB 750 mg/150 ml premix     750 mg 150 mL/hr over 60 Minutes Intravenous Every 12 hours 04/20/13 0903     04/20/13 1200  imipenem-cilastatin (PRIMAXIN) 500 mg in sodium chloride 0.9 % 100 mL IVPB  Status:  Discontinued     500 mg 200 mL/hr over 30 Minutes Intravenous Every 8 hours 04/20/13 1009 04/23/13 0952   04/20/13 0930  vancomycin (VANCOCIN) IVPB 1000 mg/200 mL premix     1,000 mg 200 mL/hr over 60 Minutes Intravenous  Once 04/20/13 0903 04/20/13 1100   04/16/13 0600  vancomycin (VANCOCIN) IVPB 750 mg/150 ml premix  Status:  Discontinued     750 mg 150 mL/hr over 60 Minutes Intravenous Every 12 hours May 04, 2013 1400  04/17/13 0858   05-04-13 2200  piperacillin-tazobactam (ZOSYN) IVPB 3.375 g  Status:  Discontinued     3.375 g 12.5 mL/hr over 240 Minutes Intravenous 3 times per day 05-04-13 1400 04/20/13 0947   04-May-2013 1530  vancomycin (VANCOCIN) 1,406 mg in sodium chloride 0.9 % 500 mL IVPB  Status:  Discontinued     20 mg/kg  70.3 kg 250 mL/hr over 120 Minutes Intravenous  Once 05-04-2013 1525 May 04, 2013 1525   05-04-13 1530  piperacillin-tazobactam (ZOSYN) IVPB 3.375 g  Status:  Discontinued     3.375 g 100 mL/hr over 30 Minutes Intravenous  Once May 04, 2013 1525 05/04/13 1525   04-May-2013 1430  [MAR Hold]  vancomycin (VANCOCIN) IVPB 1000 mg/200 mL premix     (On MAR Hold since 04-May-2013 1630)   1,000 mg 200 mL/hr over 60 Minutes Intravenous STAT 04-May-2013 1358 2013/05/04 1632   05/04/13 1430  piperacillin-tazobactam (ZOSYN) IVPB 3.375 g     3.375 g 100 mL/hr over 30 Minutes Intravenous STAT 05/04/13 1358 2013/05/04 1514       Assessment/Plan  1. POD 9, s/p open repair of right femoral hernia - A. Thomas  2. SBO, resolved with #1  3. Post op ileus, resolving  4. A fib with RVR, converted on amio  5. VDRF secondary to asp PNA  6. Leukocytosis  7. PCM/TF  Plan: 1. TFs held last night due to high residual.  He has 2 BMs last night as well.  Will restart TFs today at trickle rate.  Would hold off for now on free water or anything extra down his tube to make sure he can tolerate just the trickle rate without high residuals. 2. Defer further care to CCM   LOS: 9 days    OSBORNE,KELLY E 04/24/2013, 8:18 AM Pager: [phone]  Agree with above. Patient made NCB - possibly withdrawal of care towards the end of the week. On trickle feeds.  Ovidio Kin, MD, [facility] Pager: [phone] Office phone:  [phone]

## 2013-04-25 ENCOUNTER — Encounter (INDEPENDENT_AMBULATORY_CARE_PROVIDER_SITE_OTHER): Payer: Self-pay

## 2013-04-25 ENCOUNTER — Inpatient Hospital Stay (HOSPITAL_COMMUNITY): Payer: Medicare Other

## 2013-04-25 LAB — MAGNESIUM: Magnesium: 2.3 mg/dL (ref 1.5–2.5)

## 2013-04-25 LAB — PHOSPHORUS: Phosphorus: 4.5 mg/dL (ref 2.3–4.6)

## 2013-04-25 LAB — BLOOD GAS, ARTERIAL
Acid-Base Excess: 5.3 mmol/L — ABNORMAL HIGH (ref 0.0–2.0)
Drawn by: 232811
FIO2: 0.4 %
O2 Saturation: 90.6 %
PEEP: 5 cmH2O
Patient temperature: 99.5
RATE: 18 resp/min
TCO2: 29.9 mmol/L (ref 0–100)
pH, Arterial: 7.341 — ABNORMAL LOW (ref 7.350–7.450)
pO2, Arterial: 66.9 mmHg — ABNORMAL LOW (ref 80.0–100.0)

## 2013-04-25 LAB — CBC WITH DIFFERENTIAL/PLATELET
Basophils Absolute: 0 10*3/uL (ref 0.0–0.1)
Hemoglobin: 8.3 g/dL — ABNORMAL LOW (ref 13.0–17.0)
Lymphocytes Relative: 11 % — ABNORMAL LOW (ref 12–46)
Lymphs Abs: 1.5 10*3/uL (ref 0.7–4.0)
MCV: 83.8 fL (ref 78.0–100.0)
Neutro Abs: 11.7 10*3/uL — ABNORMAL HIGH (ref 1.7–7.7)
Neutrophils Relative %: 84 % — ABNORMAL HIGH (ref 43–77)
Platelets: 179 10*3/uL (ref 150–400)
RBC: 3.08 MIL/uL — ABNORMAL LOW (ref 4.22–5.81)
WBC: 13.9 10*3/uL — ABNORMAL HIGH (ref 4.0–10.5)

## 2013-04-25 LAB — HEPARIN LEVEL (UNFRACTIONATED)
Heparin Unfractionated: 0.19 IU/mL — ABNORMAL LOW (ref 0.30–0.70)
Heparin Unfractionated: 1.88 IU/mL — ABNORMAL HIGH (ref 0.30–0.70)
Heparin Unfractionated: 0.29 IU/mL — ABNORMAL LOW (ref 0.30–0.70)

## 2013-04-25 LAB — BASIC METABOLIC PANEL
CO2: 31 mEq/L (ref 19–32)
Chloride: 110 mEq/L (ref 96–112)
Creatinine, Ser: 2.28 mg/dL — ABNORMAL HIGH (ref 0.50–1.35)
Glucose, Bld: 142 mg/dL — ABNORMAL HIGH (ref 70–99)
Potassium: 4.1 mEq/L (ref 3.5–5.1)
Sodium: 148 mEq/L — ABNORMAL HIGH (ref 135–145)

## 2013-04-25 LAB — GLUCOSE, CAPILLARY
Glucose-Capillary: 131 mg/dL — ABNORMAL HIGH (ref 70–99)
Glucose-Capillary: 133 mg/dL — ABNORMAL HIGH (ref 70–99)
Glucose-Capillary: 135 mg/dL — ABNORMAL HIGH (ref 70–99)

## 2013-04-25 MED ORDER — HEPARIN (PORCINE) IN NACL 100-0.45 UNIT/ML-% IJ SOLN
1600.0000 [IU]/h | INTRAMUSCULAR | Status: DC
  Filled 2013-04-25 (×2): qty 250

## 2013-04-25 MED ORDER — OXEPA PO LIQD
1000.0000 mL | ORAL | Status: DC
  Administered 2013-04-25 – 2013-04-26 (×2): 1000 mL
  Filled 2013-04-25 (×2): qty 1000

## 2013-04-25 MED ORDER — AMIODARONE LOAD VIA INFUSION
150.0000 mg | Freq: Once | INTRAVENOUS | Status: AC
  Administered 2013-04-25: 150 mg via INTRAVENOUS
  Filled 2013-04-25: qty 83.34

## 2013-04-25 MED ORDER — HEPARIN (PORCINE) IN NACL 100-0.45 UNIT/ML-% IJ SOLN
1600.0000 [IU]/h | INTRAMUSCULAR | Status: DC
  Administered 2013-04-26: 1600 [IU]/h via INTRAVENOUS
  Filled 2013-04-25 (×2): qty 250

## 2013-04-25 MED ORDER — AMIODARONE IV BOLUS ONLY 150 MG/100ML
INTRAVENOUS | Status: AC
  Filled 2013-04-25: qty 100

## 2013-04-25 MED ORDER — FUROSEMIDE 10 MG/ML IJ SOLN
10.0000 mg/h | INTRAVENOUS | Status: AC
  Administered 2013-04-26: 10 mg/h via INTRAVENOUS
  Filled 2013-04-25: qty 25

## 2013-04-25 MED ORDER — METOLAZONE 5 MG PO TABS
5.0000 mg | ORAL_TABLET | Freq: Every day | ORAL | Status: AC
  Administered 2013-04-25: 5 mg via ORAL
  Filled 2013-04-25: qty 1

## 2013-04-25 NOTE — Progress Notes (Signed)
Name: Casey Davila MRN: [id number] DOB: 10-12-1925    ADMISSION DATE:  04/08/2013 CONSULTATION DATE:  05/01/2013  REFERRING MD :  Romie Levee, MD PRIMARY SERVICE: Surgery  CHIEF COMPLAINT:   Post repair of incarcerated femoral hernia. Out of the OR intubated.   BRIEF PATIENT DESCRIPTION:  79 years old male with PMH relevant for HTN.  Underwent emergent repair of incarcerated hernia and now out of the OR intubated in the ICU. CCM consult called to assist with ventilator management. . Admitted 04/19/2013 10:12 AM  LINES / TUBES: 12/14 L IJ CVL >> 12/13 ETT >>  CULTURES: 12/13 - Urine - nega 12/14 resp: pan sens Klebsiella PNA  12/14 blood >> Klebsiella ................................ 12/18 BAL>>>GNR 04/20/13 - BAL Resp Virus PCR - NEGATIVE  ANTIBIOTICS: 12/13 vanc >>12/15 12/13 zosyn >>12/18 .................... 12/18 primaxin>>>12/21 12/18 vanc>>> .......... 04/23/13 - cefepime   SIGNIFICANT EVENTS / STUDIES:  12/14 CT angio chest/ab/pelv> esophagus full of fluid? (thick), nearly occludes left mainstem with external compression; left lower lung pulm nodule; aspiration pneumonitis bilateral base, SBO, R femoral hernia 12/14 afib with rvr/shock, attempted cardioversion x3 unsuccessful 12/18 FOB, copious thick secretions 04/22/13 - 70% fio2/10 peep. Agitated on WUA.  Not making progress. Stil febrile 12/22 a-fib with RVR, amiodarone started.  SUBJECTIVE/OVERNIGHT/INTERVAL HX Sporadic episodes of a-fib with RVR.  VITAL SIGNS: Temp:  [98.7 F (37.1 C)-101.4 F (38.6 C)] 98.7 F (37.1 C) (12/23 0800) Pulse Rate:  [70-160] 82 (12/23 0900) Resp:  [17-24] 18 (12/23 0900) BP: (76-126)/(33-59) 126/52 mmHg (12/23 0900) SpO2:  [90 %-100 %] 100 % (12/23 0900) FiO2 (%):  [40 %-50 %] 40 % (12/23 1000) Weight:  [79.2 kg (174 lb 9.7 oz)] 79.2 kg (174 lb 9.7 oz) (12/23 0500)  HEMODYNAMICS: CVP:  [4 mmHg-10 mmHg] 10 mmHg  VENTILATOR SETTINGS: Vent Mode:  [-] PRVC FiO2  (%):  [40 %-50 %] 40 % Set Rate:  [18 bmp] 18 bmp Vt Set:  [450 mL] 450 mL PEEP:  [5 cmH20] 5 cmH20 Pressure Support:  [5 cmH20] 5 cmH20 Plateau Pressure:  [16 cmH20-19 cmH20] 18 cmH20  INTAKE / OUTPUT: Intake/Output     12/22 0701 - 12/23 0700 12/23 0701 - 12/24 0700   I.V. (mL/kg) 1982.6 (25) 180 (2.3)   NG/GT 550 80   IV Piggyback 200    Total Intake(mL/kg) 2732.6 (34.5) 260 (3.3)   Urine (mL/kg/hr) 3035 (1.6) 250 (1)   Total Output 3035 250   Net -302.4 +10        Stool Occurrence 2 x     PHYSICAL EXAMINATION: General: Comfortable on vent, weaning. HEENT: NCAT, ETT in place PULM: Decreased t/o CV: RRR, no mgr, NSR on tele  AB: BS noted, non-tender Ext: warm, no edema, SCD's Neuro: arouses easily, follows commands  LABS: PULMONARY  Recent Labs Lab 04/19/13 0342 04/23/13 0412 04/24/13 1250 04/25/13 0440  PHART 7.360 7.277* 7.326* 7.341*  PCO2ART 46.4* 70.8* 60.1* 59.9*  PO2ART 71.3* 88.4 64.8* 66.9*  HCO3 25.4* 31.3* 30.5* 31.3*  TCO2 23.4 29.9 29.4 29.9  O2SAT 92.9 95.2 91.3 90.6   CBC  Recent Labs Lab 04/23/13 0500 04/24/13 0400 04/25/13 0500  HGB 8.5* 8.3* 8.3*  HCT 26.7* 26.6* 25.8*  WBC 13.8* 16.0* 13.9*  PLT 104* 140* 179   COAGULATION  Recent Labs Lab 04/24/13 1255  INR 1.21    CARDIAC  No results found for this basename: TROPONINI,  in the last 168 hours  Recent Labs Lab 04/23/13 0500  PROBNP 821.7*  CHEMISTRY  Recent Labs Lab 04/18/13 1448  04/20/13 1405 04/21/13 0500 04/22/13 0330 04/23/13 0500 04/24/13 0400 04/25/13 0500  NA 140  < > 150* 152* 148* 154* 150* 148*  K 3.9  < > 3.8 3.9 4.0 4.5 4.6 4.1  CL 108  < > 113* 116* 112 118* 112 110  CO2 23  < > 30 30 30  32 30 31  GLUCOSE 86  < > 147* 146* 130* 132* 119* 142*  BUN 38*  < > 48* 52* 51* 58* 83* 94*  CREATININE 0.94  < > 1.09 1.04 1.05 1.37* 2.24* 2.28*  CALCIUM 8.4  < > 8.8 8.8 9.0 9.0 8.7 8.5  MG 2.2  --  2.0  --   --  2.2 2.4 2.3  PHOS  --   --   --   --    --  3.4 5.1* 4.5  < > = values in this interval not displayed. Estimated Creatinine Clearance: 25.2 ml/min (by C-G formula based on Cr of 2.28).  LIVER  Recent Labs Lab 04/19/13 0427 04/21/13 0500 04/22/13 0330 04/24/13 1255  AST 21 24 34  --   ALT 13 14 18   --   ALKPHOS 94 111 115  --   BILITOT 1.2 3.0* 2.9*  --   PROT 5.3* 5.1* 4.9*  --   ALBUMIN 1.8* 1.6* 1.4*  --   INR  --   --   --  1.21   INFECTIOUS  Recent Labs Lab 04/20/13 0400 04/21/13 0500 04/22/13 0330 04/24/13 0545  LATICACIDVEN  --   --   --  1.0  PROCALCITON 4.44 2.75 1.90  --    ENDOCRINE CBG (last 3)   Recent Labs  04/24/13 2349 04/25/13 0439 04/25/13 0735  GLUCAP 143* 131* 124*   IMAGING x48h  Ct Chest Wo Contrast  04/23/2013   CLINICAL DATA:  Ventilated patient. Status post repair of incarcerated femoral hernia 04/14/2013. Sepsis and pneumonia.  EXAM: CT CHEST WITHOUT CONTRAST  TECHNIQUE: Multidetector CT imaging of the chest was performed following the standard protocol without IV contrast.  COMPARISON:  CT chest 04/22/2013. Plain films of the chest 12 19/2014, 04/22/2013 and 04/23/2013.  FINDINGS: The patient has small bilateral pleural effusions. No pericardial effusion is identified. Heart size is normal. Endotracheal tube is in place with the tip well above the carina. NG tube courses into the stomach. There is no axillary, hilar or mediastinal lymphadenopathy. Fluid is identified within the esophagus but distention of the esophagus seen on the prior study is markedly improved. Compression of the left mainstem bronchus has nearly completely resolved.  The lungs demonstrate extensive bilateral airspace disease, worse on the right, where it is panlobar. There is relative sparing of the left upper lobe. Consolidative change appears worst in the lung bases and on the right. Airspace disease has markedly progressed since the prior CT scan. 1.1 cm left lower lobe nodule seen on the prior study is not  visualized today and could be obscured by pneumonia.  Visualized upper abdomen is unremarkable. No lytic or sclerotic bony lesion is seen.  IMPRESSION: Marked progression of extensive bilateral airspace disease, worse on the right, consistent with pneumonia since the patient's prior CT scan.  Interval decrease in distention of the esophagus. Marked mass effect on left main stem bronchus seen on the prior CT is nearly completely resolved. There is some fluid within the esophagus as on the prior study.  Small bilateral pleural effusions, larger on the right.  Electronically Signed   By: Drusilla Kanner M.D.   On: 04/23/2013 17:34   Dg Chest Port 1 View  04/25/2013   CLINICAL DATA:  Intubation.  EXAM: PORTABLE CHEST - 1 VIEW  COMPARISON:  04/25/2013.  FINDINGS: Endotracheal tube, left IJ line in stable position. NG tube now noted below left hemidiaphragm. Heart size and pulmonary vascularity stable. Dense bilateral pulmonary alveolar infiltrates are present. Bilateral pleural effusions are present. No pneumothorax. No acute osseous abnormality.  IMPRESSION: 1. NG tube now noted with tip below the left hemidiaphragm. Central line and endotracheal tube in stable position. 2. Dense bilateral pulmonary infiltrates particularly on the right. Associated small bilateral effusions. Congestive heart failure with pulmonary edema could present in this fashion. Other etiologies of pulmonary infiltrates including pneumonia should also be considered.   Electronically Signed   By: Maisie Fus  Register   On: 04/25/2013 08:01   Dg Chest Port 1 View  04/24/2013   CLINICAL DATA:  Intubation.  EXAM: PORTABLE CHEST - 1 VIEW  COMPARISON:  CT chest 04/23/2013.  Chest x-ray 04/23/2013.  FINDINGS: Endotracheal tube and left IJ line in stable position. NG tube has been withdrawn, as tip is in the distal esophagus. Persistent bilateral pulmonary infiltrates are again noted particularly throughout the right lung. Associated atelectatic  changes noted. Right pleural effusion is noted. Left hemidiaphragm is incompletely imaged. Heart size is normal. No pneumothorax.  IMPRESSION: 1. NG tube is with been withdrawn slightly and is in the distal esophagus. 2. Persistent bilateral pulmonary infiltrates particularly throughout the right lung. There is persistent atelectatic changes. Persistent right pleural effusion. This report was phoned to the patient's Nurse Judeth Cornfield at 7:24 a.m. on 04/24/2013 .   Electronically Signed   By: Maisie Fus  Register   On: 04/24/2013 07:24   Dg Chest Port 1 View  04/23/2013   CLINICAL DATA:  Respiratory failure  EXAM: PORTABLE CHEST - 1 VIEW  COMPARISON:  04/22/2013  FINDINGS: Endotracheal tube terminates 6 cm above the carina.  Multifocal patchy opacities in the right lung and left lung base, suspicious for pneumonia. Asymmetric interstitial edema is considered unlikely. No pleural effusion or pneumothorax.  The heart is normal in size.  Stable left IJ venous catheter terminating in the mid SVC. Enteric tube courses into the stomach.  IMPRESSION: Endotracheal tube terminates 6 cm above the carina.  Multifocal patchy opacities, right lung predominant, suspicious for pneumonia.   Electronically Signed   By: Charline Bills M.D.   On: 04/23/2013 10:38   Dg Abd Portable 1v  04/24/2013   CLINICAL DATA:  Nasogastric tube placement at bedside.  EXAM: PORTABLE ABDOMEN - 1 VIEW  COMPARISON:  04/19/2013.  FINDINGS: Nasogastric tube tip overlies proximal body of the stomach. Bowel gas pattern unremarkable without evidence of obstruction or significant ileus. Large stool burden in the colon.  IMPRESSION: 1. Nasogastric tube tip overlies the proximal body of the stomach. 2. No acute abdominal abnormality.  Large stool burden.   Electronically Signed   By: Hulan Saas M.D.   On: 04/24/2013 08:49   ASSESSMENT / PLAN:  PULMONARY A: 1) Acute hypoxemic respiratory failure due to aspiration pneumonitis/ ARDS   04/22/13  -  Persistent ARDS. Net 3L positive past 7 days 12./21/14 - Improved to 50% fio2/peep 10 12/22 improved PEEP 5 and FiO2 of 40%. P:   - Begin PS trials but no extubation given WOB. - See ID sectional. - CXR and ABG in AM. - Diureses as ordered. - Move ET tube  down 4 cm.  CARDIOVASCULAR A:  1) Septic shock > resolved 2) New onset afib with RVR due to sepsis and levophed, failed cardioversion; converted with amiodarone, Now NSR w/ occ PAC, had Run of SVT 12/19 04/23/13 - sinus tach P:  - PRN lopressor. - KVO IVFs. - Heparin drip per pharmacy and will need coumadin as outpatient. - Amiodarone drip as ordered.  RENAL A:   Acute renal failure> resolved Hypernatremia s/p diuresis , and worsening Also 3rd spacing  P:   - Free water. - F/U chem. - Lasix drip as ordered for 24hours at 10 mg/hr. - Zaroxolyn as ordered. - Replace electrolytes as needed.  GASTROINTESTINAL A:   1) R femoral hernia and SBO, viable bowel; Status post surgical repair  04/23/13 - residuals + but < 400cc P:   - GI prophylaxis with protonix. - Advance TF as tolerated.  HEMATOLOGIC A:   ANemia of critical illness Mild thrombocytopenia (dropped 50%) due to illness: Plts holding in the 70-80 range. Changed to LMWH on 12/16.  HTT negative 12/16//14  P:  - PRBC for hgb </= 6.9gm%    - exceptions are   -  if ACS susepcted/confirmed then transfuse for hgb </= 8.0gm%,  or    - active bleeding with hemodynamic instability, then transfuse regardless of hemoglobin value   At at all times try to transfuse 1 unit prbc as possible with exception of active hemorrhage  INFECTIOUS A:   1) Aspiration PNA + Klebsiella PNA (Pan sens)   04/23/13  - fever curve worse despite change to primaxin on 04/20/13  P:   - Changed primaxin to cefepime. - Continue vanc. - See above, f/u culture data narrow when able  - Hold off starting anti-fungal for now.  ENDOCRINE A:   1) No issues    NEUROLOGIC A:   1)  Intubated, sedated  04/23/13 - Agiated on WUA  P:   - Sedation and pain management with fentanyl - RASS score goal of -1  Global:   Will attempt more diureses, if fails then will consider withdrawal.  The patient is critically ill with multiple organ systems failure and requires high complexity decision making for assessment and support, frequent evaluation and titration of therapies, application of advanced monitoring technologies and extensive interpretation of multiple databases.   Critical Care Time devoted to patient care services described in this note is 35 minutes.  Alyson Reedy, M.D. [facility] Pulmonary/Critical Care Medicine. Pager: [phone]. After hours pager: [phone].

## 2013-04-25 NOTE — Progress Notes (Addendum)
ANTICOAGULATION CONSULT NOTE - Follow Up Consult  Pharmacy Consult for heparin Indication: atrial fibrillation  Allergies  Allergen Reactions  . Vicodin [Hydrocodone-Acetaminophen] Nausea And Vomiting    Patient Measurements: Height: 5\' 11"  (180.3 cm) Weight: 174 lb 9.7 oz (79.2 kg) IBW/kg (Calculated) : 75.3 Heparin Dosing Weight: 79.2kg  Vital Signs: Temp: 98.9 F (37.2 C) (12/23 1537) Temp src: Oral (12/23 1537) BP: 106/36 mmHg (12/23 1500) Pulse Rate: 70 (12/23 1500)  Labs:  Recent Labs  04/23/13 0500 04/24/13 0400 04/24/13 1255 04/24/13 2000 04/25/13 0500 04/25/13 1420  HGB 8.5* 8.3*  --   --  8.3*  --   HCT 26.7* 26.6*  --   --  25.8*  --   PLT 104* 140*  --   --  179  --   APTT  --   --  32  --   --   --   LABPROT  --   --  15.0  --   --   --   INR  --   --  1.21  --   --   --   HEPARINUNFRC  --   --   --  0.22* 0.19* 1.88*  CREATININE 1.37* 2.24*  --   --  2.28*  --     Estimated Creatinine Clearance: 25.2 ml/min (by C-G formula based on Cr of 2.28).   Assessment: 79 yo male s/p emergent repair of incarcerated femoral hernia on 12/13, post-op course complicated by respiratory failure, septic shock, acute renal failure and new onset afib. Failed cardioversion, but converted to NSR on amiodarone which was dc'd 12/16. Today, 12/22, back into afib and IV amiodarone resumed. Heparin gtt started for stroke prevention.     Heparin level = 1.88 on heparin 1500 units/hr - SUPRAtherapeutic (suspect blood contaminated with heparin)  Heparin levels have been subtherapeutic prior to latest lab  CBC: Hgb = 8.3 (stable), platelets now WNL (trending up)  SCr - worsening  Goal of Therapy:  Heparin level 0.3-0.7 units/ml Monitor platelets by anticoagulation protocol: Yes   Plan:   Heparin infusing at expected rate, level MUCH higher than anticipated.  I suspect that blood sample was somehow contaminated with heparin (blood drawn from CVC, heparin infusing via  CVC).  RN followed protocol for drawing labs but will redraw to confirm d/t such large increase in heparin level from this am.   Juliette Alcide, PharmD, BCPS.   Pager: [phone]  04/25/2013,3:40 PM   ADDENDUM:   Repeat heparin level = 0.29 (more consistent with previous levels)  Goal of Therapy:  Heparin level 0.3-0.7 units/ml Monitor platelets by anticoagulation protocol: Yes  Plan:  Increase heparin gtt to 1600 units/hr  Check heparin level with am labs   Juliette Alcide, PharmD, BCPS.   Pager: [phone] 04/25/2013 5:45 PM

## 2013-04-25 NOTE — Progress Notes (Signed)
Pt placed in CPAP/PS 10/5 50% per Dr. Molli Knock.

## 2013-04-25 NOTE — Progress Notes (Signed)
Patient ID: HENCE Casey Davila, Casey Davila   DOB: 06-11-1925, 79 y.o.   MRN: [id number] 10 Days Post-Op  Subjective: Pt awakened for me today and follow commands.  He denied abdominal pain and denied being cold.  No residuals with trickle tube feeds  Objective: Vital signs in last 24 hours: Temp:  [98.7 F (37.1 C)-101.4 F (38.6 C)] 98.7 F (37.1 C) (12/23 0800) Pulse Rate:  [70-160] 74 (12/23 0700) Resp:  [17-26] 18 (12/23 0800) BP: (76-138)/(33-59) 102/59 mmHg (12/23 0800) SpO2:  [90 %-100 %] 92 % (12/23 0700) FiO2 (%):  [40 %-50 %] 40 % (12/23 0800) Weight:  [174 lb 9.7 oz (79.2 kg)] 174 lb 9.7 oz (79.2 kg) (12/23 0500) Last BM Date: 04/24/13  Intake/Output from previous day: 12/22 0701 - 12/23 0700 In: 2732.6 [I.V.:1982.6; NG/GT:550; IV Piggyback:200] Out: 3035 [Urine:3035] Intake/Output this shift: Total I/O In: 85 [I.V.:65; NG/GT:20] Out: -   PE: Abd: soft, right inguinal incision c/d/i, +BS  Lab Results:   Recent Labs  04/24/13 0400 04/25/13 0500  WBC 16.0* 13.9*  HGB 8.3* 8.3*  HCT 26.6* 25.8*  PLT 140* 179   BMET  Recent Labs  04/24/13 0400 04/25/13 0500  NA 150* 148*  K 4.6 4.1  CL 112 110  CO2 30 31  GLUCOSE 119* 142*  BUN 83* 94*  CREATININE 2.24* 2.28*  CALCIUM 8.7 8.5   PT/INR  Recent Labs  04/24/13 1255  LABPROT 15.0  INR 1.21   CMP     Component Value Date/Time   NA 148* 04/25/2013 0500   K 4.1 04/25/2013 0500   CL 110 04/25/2013 0500   CO2 31 04/25/2013 0500   GLUCOSE 142* 04/25/2013 0500   BUN 94* 04/25/2013 0500   CREATININE 2.28* 04/25/2013 0500   CALCIUM 8.5 04/25/2013 0500   PROT 4.9* 04/22/2013 0330   ALBUMIN 1.4* 04/22/2013 0330   AST 34 04/22/2013 0330   ALT 18 04/22/2013 0330   ALKPHOS 115 04/22/2013 0330   BILITOT 2.9* 04/22/2013 0330   GFRNONAA 25* 04/25/2013 0500   GFRAA 28* 04/25/2013 0500   Lipase  No results found for this basename: lipase       Studies/Results: Ct Chest Wo Contrast  04/23/2013    CLINICAL DATA:  Ventilated patient. Status post repair of incarcerated femoral hernia 04/03/2013. Sepsis and pneumonia.  EXAM: CT CHEST WITHOUT CONTRAST  TECHNIQUE: Multidetector CT imaging of the chest was performed following the standard protocol without IV contrast.  COMPARISON:  CT chest 04/23/2013. Plain films of the chest 12 19/2014, 04/22/2013 and 04/23/2013.  FINDINGS: The patient has small bilateral pleural effusions. No pericardial effusion is identified. Heart size is normal. Endotracheal tube is in place with the tip well above the carina. NG tube courses into the stomach. There is no axillary, hilar or mediastinal lymphadenopathy. Fluid is identified within the esophagus but distention of the esophagus seen on the prior study is markedly improved. Compression of the left mainstem bronchus has nearly completely resolved.  The lungs demonstrate extensive bilateral airspace disease, worse on the right, where it is panlobar. There is relative sparing of the left upper lobe. Consolidative change appears worst in the lung bases and on the right. Airspace disease has markedly progressed since the prior CT scan. 1.1 cm left lower lobe nodule seen on the prior study is not visualized today and could be obscured by pneumonia.  Visualized upper abdomen is unremarkable. No lytic or sclerotic bony lesion is seen.  IMPRESSION: Marked progression of  extensive bilateral airspace disease, worse on the right, consistent with pneumonia since the patient's prior CT scan.  Interval decrease in distention of the esophagus. Marked mass effect on left main stem bronchus seen on the prior CT is nearly completely resolved. There is some fluid within the esophagus as on the prior study.  Small bilateral pleural effusions, larger on the right.   Electronically Signed   By: Drusilla Kanner M.D.   On: 04/23/2013 17:34   Dg Chest Port 1 View  04/25/2013   CLINICAL DATA:  Intubation.  EXAM: PORTABLE CHEST - 1 VIEW  COMPARISON:   04/25/2013.  FINDINGS: Endotracheal tube, left IJ line in stable position. NG tube now noted below left hemidiaphragm. Heart size and pulmonary vascularity stable. Dense bilateral pulmonary alveolar infiltrates are present. Bilateral pleural effusions are present. No pneumothorax. No acute osseous abnormality.  IMPRESSION: 1. NG tube now noted with tip below the left hemidiaphragm. Central line and endotracheal tube in stable position. 2. Dense bilateral pulmonary infiltrates particularly on the right. Associated small bilateral effusions. Congestive heart failure with pulmonary edema could present in this fashion. Other etiologies of pulmonary infiltrates including pneumonia should also be considered.   Electronically Signed   By: Maisie Fus  Register   On: 04/25/2013 08:01   Dg Chest Port 1 View  04/24/2013   CLINICAL DATA:  Intubation.  EXAM: PORTABLE CHEST - 1 VIEW  COMPARISON:  CT chest 04/23/2013.  Chest x-ray 04/23/2013.  FINDINGS: Endotracheal tube and left IJ line in stable position. NG tube has been withdrawn, as tip is in the distal esophagus. Persistent bilateral pulmonary infiltrates are again noted particularly throughout the right lung. Associated atelectatic changes noted. Right pleural effusion is noted. Left hemidiaphragm is incompletely imaged. Heart size is normal. No pneumothorax.  IMPRESSION: 1. NG tube is with been withdrawn slightly and is in the distal esophagus. 2. Persistent bilateral pulmonary infiltrates particularly throughout the right lung. There is persistent atelectatic changes. Persistent right pleural effusion. This report was phoned to the patient's Nurse Judeth Cornfield at 7:24 a.m. on 04/24/2013 .   Electronically Signed   By: Maisie Fus  Register   On: 04/24/2013 07:24   Dg Chest Port 1 View  04/23/2013   CLINICAL DATA:  Respiratory failure  EXAM: PORTABLE CHEST - 1 VIEW  COMPARISON:  04/22/2013  FINDINGS: Endotracheal tube terminates 6 cm above the carina.  Multifocal patchy  opacities in the right lung and left lung base, suspicious for pneumonia. Asymmetric interstitial edema is considered unlikely. No pleural effusion or pneumothorax.  The heart is normal in size.  Stable left IJ venous catheter terminating in the mid SVC. Enteric tube courses into the stomach.  IMPRESSION: Endotracheal tube terminates 6 cm above the carina.  Multifocal patchy opacities, right lung predominant, suspicious for pneumonia.   Electronically Signed   By: Charline Bills M.D.   On: 04/23/2013 10:38   Dg Abd Portable 1v  04/24/2013   CLINICAL DATA:  Nasogastric tube placement at bedside.  EXAM: PORTABLE ABDOMEN - 1 VIEW  COMPARISON:  04/19/2013.  FINDINGS: Nasogastric tube tip overlies proximal body of the stomach. Bowel gas pattern unremarkable without evidence of obstruction or significant ileus. Large stool burden in the colon.  IMPRESSION: 1. Nasogastric tube tip overlies the proximal body of the stomach. 2. No acute abdominal abnormality.  Large stool burden.   Electronically Signed   By: Hulan Saas M.D.   On: 04/24/2013 08:49    Anti-infectives: Anti-infectives   Start  Dose/Rate Route Frequency Ordered Stop   04/24/13 2200  vancomycin (VANCOCIN) IVPB 750 mg/150 ml premix     750 mg 150 mL/hr over 60 Minutes Intravenous Every 24 hours 04/24/13 0911     04/24/13 2200  ceFEPIme (MAXIPIME) 1 g in dextrose 5 % 50 mL IVPB     1 g 100 mL/hr over 30 Minutes Intravenous Every 24 hours 04/24/13 0911     04/23/13 1100  ceFEPIme (MAXIPIME) 1 g in dextrose 5 % 50 mL IVPB  Status:  Discontinued     1 g 100 mL/hr over 30 Minutes Intravenous Every 12 hours 04/23/13 1014 04/24/13 0911   04/20/13 2200  vancomycin (VANCOCIN) IVPB 750 mg/150 ml premix  Status:  Discontinued     750 mg 150 mL/hr over 60 Minutes Intravenous Every 12 hours 04/20/13 0903 04/24/13 0911   04/20/13 1200  imipenem-cilastatin (PRIMAXIN) 500 mg in sodium chloride 0.9 % 100 mL IVPB  Status:  Discontinued     500  mg 200 mL/hr over 30 Minutes Intravenous Every 8 hours 04/20/13 1009 04/23/13 0952   04/20/13 0930  vancomycin (VANCOCIN) IVPB 1000 mg/200 mL premix     1,000 mg 200 mL/hr over 60 Minutes Intravenous  Once 04/20/13 0903 04/20/13 1100   04/16/13 0600  vancomycin (VANCOCIN) IVPB 750 mg/150 ml premix  Status:  Discontinued     750 mg 150 mL/hr over 60 Minutes Intravenous Every 12 hours 04/04/2013 1400 04/17/13 0858   05/01/2013 2200  piperacillin-tazobactam (ZOSYN) IVPB 3.375 g  Status:  Discontinued     3.375 g 12.5 mL/hr over 240 Minutes Intravenous 3 times per day 04/11/2013 1400 04/20/13 0947   04/14/2013 1530  vancomycin (VANCOCIN) 1,406 mg in sodium chloride 0.9 % 500 mL IVPB  Status:  Discontinued     20 mg/kg  70.3 kg 250 mL/hr over 120 Minutes Intravenous  Once 04/04/2013 1525 05/03/2013 1525   04/05/2013 1530  piperacillin-tazobactam (ZOSYN) IVPB 3.375 g  Status:  Discontinued     3.375 g 100 mL/hr over 30 Minutes Intravenous  Once 04/25/2013 1525 04/06/2013 1525   04/03/2013 1430  [MAR Hold]  vancomycin (VANCOCIN) IVPB 1000 mg/200 mL premix     (On MAR Hold since 04/17/2013 1630)   1,000 mg 200 mL/hr over 60 Minutes Intravenous STAT 04/18/2013 1358 04/06/2013 1632   04/19/2013 1430  piperacillin-tazobactam (ZOSYN) IVPB 3.375 g     3.375 g 100 mL/hr over 30 Minutes Intravenous STAT 04/09/2013 1358 04/21/2013 1514       Assessment/Plan 1. POD 10, s/p open repair of right femoral hernia - A. Thomas  2. SBO, resolved with #1  3. Post op ileus, resolving  4. A fib with RVR, converted on amio  5. VDRF secondary to asp PNA  6. Leukocytosis  7. PCM/TF  Plan:  1. Plans for DNR and withdrawal noted for end of week.  Patient followed some commands today on my evaluation. 2. Unsure role TFs are going to play at this point if plans for withdrawal over the next several days, but can increase some today and see how he does.   LOS: 10 days    Paityn Balsam E 04/25/2013, 8:19 AM Pager: [phone]

## 2013-04-25 NOTE — Progress Notes (Signed)
ANTICOAGULATION CONSULT NOTE - F/U Consult  Pharmacy Consult for Heparin Indication: atrial fibrillation  Allergies  Allergen Reactions  . Vicodin [Hydrocodone-Acetaminophen] Nausea And Vomiting    Patient Measurements: Height: 5\' 11"  (180.3 cm) Weight: 176 lb 2.4 oz (79.9 kg) IBW/kg (Calculated) : 75.3 Heparin Dosing Weight: 79.9kg  Vital Signs: Temp: 99.5 F (37.5 C) (12/23 0400) Temp src: Oral (12/23 0400) BP: 111/53 mmHg (12/23 0500) Pulse Rate: 117 (12/23 0500)  Labs:  Recent Labs  04/23/13 0500 04/24/13 0400 04/24/13 1255 04/24/13 2000 04/25/13 0500  HGB 8.5* 8.3*  --   --  8.3*  HCT 26.7* 26.6*  --   --  25.8*  PLT 104* 140*  --   --  179  APTT  --   --  32  --   --   LABPROT  --   --  15.0  --   --   INR  --   --  1.21  --   --   HEPARINUNFRC  --   --   --  0.22* 0.19*  CREATININE 1.37* 2.24*  --   --   --     Estimated Creatinine Clearance: 25.7 ml/min (by C-G formula based on Cr of 2.24).   Medical History: Past Medical History  Diagnosis Date  . Hypertension     Medications:  Scheduled:  . antiseptic oral rinse  1 application Mouth Rinse QID  . bisacodyl  10 mg Rectal Daily  . ceFEPime (MAXIPIME) IV  1 g Intravenous Q24H  . chlorhexidine  15 mL Mouth/Throat BID  . feeding supplement (OXEPA)  1,000 mL Per Tube Q24H  . free water  250 mL Per Tube Q6H  . metoCLOPramide (REGLAN) injection  5 mg Intravenous Q6H  . pantoprazole sodium  40 mg Per Tube Daily  . vancomycin  750 mg Intravenous Q24H   Infusions:  . sodium chloride 10 mL/hr at 04/24/13 2100  . amiodarone (NEXTERONE PREMIX) 360 mg/200 mL dextrose 60 mg/hr (04/25/13 0511)  . fentaNYL infusion INTRAVENOUS 100 mcg/hr (04/24/13 2229)  . furosemide (LASIX) infusion 5 mg/hr (04/24/13 1900)  . heparin      Assessment: 79 yo male s/p emergent repair of incarcerated femoral hernia on 12/13, post-op course complicated by respiratory failure, septic shock, acute renal failure and new onset  afib. Failed cardioversion, but converted to NSR on amiodarone which was dc'd 12/16. Today, 12/22, back into afib and IV amiodarone resumed. Now Pharmacy is asked to start IV heparin.   Patient received lovenox 40mg  at 10am this morning, therefore will not bolus heparin.  H/H low, Platelets have improved, HIT negative. Baseline apTT/INR are WNL  Scr is elevated 2.24, and trending up.  CrCl 25 ml/min  Heparin level low x 2: 0.22 >0.19, no problems per RN   Goal of Therapy:  Heparin level 0.3-0.7 units/ml Monitor platelets by anticoagulation protocol: Yes   Plan:  1.) Increase heparin gtt to 1250 units/hr (12 ml/hr) 2.) Heparin level 8 hours after change in gtt rate ~ 1400 3.) DHL, CBC 4.) Monitor for s/sx bleeding   Lorenza Evangelist PharmD Pager #: [phone] 6:04 AM 04/25/2013

## 2013-04-25 NOTE — Progress Notes (Signed)
Agree with A&P of KO,PA. He seems stable, opened eyes when I talked to him, did not squeeze fingers on command. Abd is benign

## 2013-04-26 ENCOUNTER — Inpatient Hospital Stay (HOSPITAL_COMMUNITY): Payer: Medicare Other

## 2013-04-26 DIAGNOSIS — A419 Sepsis, unspecified organism: Secondary | ICD-10-CM

## 2013-04-26 DIAGNOSIS — E876 Hypokalemia: Secondary | ICD-10-CM

## 2013-04-26 LAB — HEPARIN LEVEL (UNFRACTIONATED)
Heparin Unfractionated: 0.19 IU/mL — ABNORMAL LOW (ref 0.30–0.70)
Heparin Unfractionated: 0.26 IU/mL — ABNORMAL LOW (ref 0.30–0.70)
Heparin Unfractionated: 0.34 IU/mL (ref 0.30–0.70)

## 2013-04-26 LAB — PHOSPHORUS: Phosphorus: 4.9 mg/dL — ABNORMAL HIGH (ref 2.3–4.6)

## 2013-04-26 LAB — BLOOD GAS, ARTERIAL
Acid-Base Excess: 9.3 mmol/L — ABNORMAL HIGH (ref 0.0–2.0)
Drawn by: 317871
FIO2: 0.4 %
MECHVT: 0.45 mL
O2 Saturation: 91.9 %
PEEP: 5 cmH2O
Patient temperature: 99.3
TCO2: 33.9 mmol/L (ref 0–100)
pCO2 arterial: 66 mmHg (ref 35.0–45.0)
pO2, Arterial: 71.1 mmHg — ABNORMAL LOW (ref 80.0–100.0)

## 2013-04-26 LAB — BASIC METABOLIC PANEL
BUN: 91 mg/dL — ABNORMAL HIGH (ref 6–23)
BUN: 88 mg/dL — ABNORMAL HIGH (ref 6–23)
CO2: 36 mEq/L — ABNORMAL HIGH (ref 19–32)
CO2: 38 mEq/L — ABNORMAL HIGH (ref 19–32)
Calcium: 8.5 mg/dL (ref 8.4–10.5)
Calcium: 8.5 mg/dL (ref 8.4–10.5)
Chloride: 104 mEq/L (ref 96–112)
Chloride: 103 mEq/L (ref 96–112)
Creatinine, Ser: 2.09 mg/dL — ABNORMAL HIGH (ref 0.50–1.35)
Potassium: 3 mEq/L — ABNORMAL LOW (ref 3.5–5.1)
Sodium: 147 mEq/L — ABNORMAL HIGH (ref 135–145)

## 2013-04-26 LAB — CBC WITH DIFFERENTIAL/PLATELET
Basophils Absolute: 0 10*3/uL (ref 0.0–0.1)
Eosinophils Relative: 1 % (ref 0–5)
Lymphocytes Relative: 9 % — ABNORMAL LOW (ref 12–46)
MCV: 82.2 fL (ref 78.0–100.0)
Neutro Abs: 11 10*3/uL — ABNORMAL HIGH (ref 1.7–7.7)
Neutrophils Relative %: 86 % — ABNORMAL HIGH (ref 43–77)
Platelets: 237 10*3/uL (ref 150–400)
RDW: 16.5 % — ABNORMAL HIGH (ref 11.5–15.5)
WBC: 12.8 10*3/uL — ABNORMAL HIGH (ref 4.0–10.5)

## 2013-04-26 LAB — GLUCOSE, CAPILLARY
Glucose-Capillary: 139 mg/dL — ABNORMAL HIGH (ref 70–99)
Glucose-Capillary: 145 mg/dL — ABNORMAL HIGH (ref 70–99)
Glucose-Capillary: 154 mg/dL — ABNORMAL HIGH (ref 70–99)

## 2013-04-26 LAB — MAGNESIUM: Magnesium: 2.1 mg/dL (ref 1.5–2.5)

## 2013-04-26 LAB — VANCOMYCIN, TROUGH: Vancomycin Tr: 23.4 ug/mL — ABNORMAL HIGH (ref 10.0–20.0)

## 2013-04-26 MED ORDER — PRO-STAT SUGAR FREE PO LIQD
30.0000 mL | ORAL | Status: DC
  Administered 2013-04-26 – 2013-04-27 (×2): 30 mL
  Filled 2013-04-26 (×3): qty 30

## 2013-04-26 MED ORDER — OXEPA PO LIQD
1000.0000 mL | ORAL | Status: DC
  Administered 2013-04-27: 1000 mL
  Filled 2013-04-26 (×2): qty 1000

## 2013-04-26 MED ORDER — FUROSEMIDE 10 MG/ML IJ SOLN
10.0000 mg/h | INTRAVENOUS | Status: AC
  Administered 2013-04-27: 10 mg/h via INTRAVENOUS
  Filled 2013-04-26: qty 25

## 2013-04-26 MED ORDER — ACETAZOLAMIDE SODIUM 500 MG IJ SOLR
250.0000 mg | Freq: Three times a day (TID) | INTRAMUSCULAR | Status: AC
  Administered 2013-04-26 (×2): 250 mg via INTRAVENOUS
  Filled 2013-04-26 (×3): qty 500

## 2013-04-26 MED ORDER — POTASSIUM CHLORIDE 20 MEQ/15ML (10%) PO LIQD
40.0000 meq | Freq: Three times a day (TID) | ORAL | Status: AC
  Administered 2013-04-26 (×2): 40 meq
  Filled 2013-04-26 (×2): qty 30

## 2013-04-26 MED ORDER — POTASSIUM CHLORIDE CRYS ER 20 MEQ PO TBCR: 40.0000 meq | EXTENDED_RELEASE_TABLET | Freq: Three times a day (TID) | ORAL | Status: DC

## 2013-04-26 MED ORDER — HEPARIN (PORCINE) IN NACL 100-0.45 UNIT/ML-% IJ SOLN
1700.0000 [IU]/h | INTRAMUSCULAR | Status: DC
  Administered 2013-04-26: 1700 [IU]/h via INTRAVENOUS
  Filled 2013-04-26 (×3): qty 250

## 2013-04-26 MED ORDER — METOLAZONE 5 MG PO TABS
5.0000 mg | ORAL_TABLET | Freq: Every day | ORAL | Status: AC
  Administered 2013-04-26: 5 mg via ORAL
  Filled 2013-04-26: qty 1

## 2013-04-26 MED ORDER — OXEPA PO LIQD: 1000.0000 mL | ORAL | Status: DC

## 2013-04-26 MED ORDER — VANCOMYCIN HCL 500 MG IV SOLR
500.0000 mg | INTRAVENOUS | Status: DC
  Administered 2013-04-27 – 2013-04-28 (×2): 500 mg via INTRAVENOUS
  Filled 2013-04-26 (×2): qty 500

## 2013-04-26 MED ORDER — POTASSIUM CHLORIDE 10 MEQ/50ML IV SOLN
10.0000 meq | INTRAVENOUS | Status: AC
  Administered 2013-04-26 (×6): 10 meq via INTRAVENOUS
  Filled 2013-04-26 (×6): qty 50

## 2013-04-26 NOTE — Progress Notes (Signed)
Pharmacy Consult for IV Heparin Indication: atrial fibrillation  Heparin Dosing Weight: 79.2 kg  Please refer to notes done earlier today by Tacey Ruiz, [facility] for more details. Patient is currently receiving IV heparin at 1700 units/hr and heparin level this evening = 0.34, therapeutic. Clarified with RN, HL was drawn peripherally, no issues.   Goal of Therapy:  Heparin level 0.3-0.7 units/ml Monitor platelets by anticoagulation protocol: Yes   Plan: Continue IV heparin at 1700 units/hr. Will push next heparin level to AM lab tomorrow.   Geoffry Paradise, PharmD, BCPS Pager: [phone] 5:48 PM Pharmacy #: [phone]

## 2013-04-26 NOTE — Progress Notes (Signed)
Chaplain responded to spiritual care consult.  Provided emotional and spiritual support with pt's spouse.  Spouse supported by [employer] and family, states that she often has had "too much support" and would like some quiet time.  Spouse describes having peace with pt's condition and pending end of life.  Spoke with chaplain about how peace around decisions does not take away sadness.   Chaplain will continue follow up as needed.

## 2013-04-26 NOTE — Progress Notes (Signed)
eLink Physician-Brief Progress Note Patient Name: Casey Davila DOB: [birth year] MRN: [id number]  Date of Service  04/26/2013   HPI/Events of Note  Hypokalemia   eICU Interventions  Potassium replaced   Intervention Category Intermediate Interventions: Electrolyte abnormality - evaluation and management  DETERDING,ELIZABETH 04/26/2013, 4:50 AM

## 2013-04-26 NOTE — Progress Notes (Signed)
Agree with A&P of WJ,PA. He actually seems more responsive today, although only minimally responsive. No residuals so can advance TF to goal

## 2013-04-26 NOTE — Progress Notes (Signed)
ANTIBIOTIC CONSULT NOTE - FOLLOW UP  Pharmacy Consult for Vancomycin Indication: HCAP  Allergies  Allergen Reactions  . Vicodin [Hydrocodone-Acetaminophen] Nausea And Vomiting    Patient Measurements: Height: 5\' 11"  (180.3 cm) Weight: 169 lb 12.1 oz (77 kg) IBW/kg (Calculated) : 75.3 Adjusted Body Weight:   Vital Signs: Temp: 98 F (36.7 C) (12/24 2000) Temp src: Axillary (12/24 2000) BP: 129/53 mmHg (12/24 2200) Pulse Rate: 81 (12/24 2200) Intake/Output from previous day: 12/23 0701 - 12/24 0700 In: 3322.1 [I.V.:1752.1; NG/GT:1270; IV Piggyback:300] Out: 5685 [Urine:5685] Intake/Output from this shift: Total I/O In: 292 [I.V.:172; NG/GT:120] Out: 750 [Urine:750]  Labs:  Recent Labs  04/24/13 0400 04/25/13 0500 04/26/13 0343 04/26/13 1400  WBC 16.0* 13.9* 12.8*  --   HGB 8.3* 8.3* 8.7*  --   PLT 140* 179 237  --   CREATININE 2.24* 2.28* 2.15* 2.09*   Estimated Creatinine Clearance: 27.5 ml/min (by C-G formula based on Cr of 2.09).  Recent Labs  04/26/13 2150  VANCOTROUGH 23.4*     Microbiology: Recent Results (from the past 720 hour(s))  URINE CULTURE     Status: None   Collection Time    04/13/2013  1:15 PM      Result Value Range Status   Specimen Description URINE, CLEAN CATCH   Final   Special Requests NONE   Final   Culture  Setup Time     Final   Value: 04/08/2013 17:22     Performed at [facility] Count     Final   Value: 7,000 COLONIES/ML     Performed at [facility]   Culture     Final   Value: INSIGNIFICANT GROWTH     Performed at [facility]   Report Status 04/16/2013 FINAL   Final  CULTURE, BLOOD (ROUTINE X 2)     Status: None   Collection Time    04/16/13  9:05 AM      Result Value Range Status   Specimen Description BLOOD LEFT ANTECUBITAL   Final   Special Requests BOTTLES DRAWN AEROBIC AND ANAEROBIC 5CC   Final   Culture  Setup Time     Final   Value: 04/16/2013 17:04     Performed at  [facility]   Culture     Final   Value: KLEBSIELLA PNEUMONIAE     Note: Gram Stain Report Called to,Read Back By and Verified With: Donetta Potts 04/21/13 AT 0240 RIDK     Performed at [facility]   Report Status 04/23/2013 FINAL   Final   Organism ID, Bacteria KLEBSIELLA PNEUMONIAE   Final  CULTURE, BLOOD (ROUTINE X 2)     Status: None   Collection Time    04/16/13  9:15 AM      Result Value Range Status   Specimen Description BLOOD RIGHT WRIST   Final   Special Requests BOTTLES DRAWN AEROBIC AND ANAEROBIC 5CC   Final   Culture  Setup Time     Final   Value: 04/16/2013 17:04     Performed at [facility]   Culture     Final   Value: NO GROWTH 5 DAYS     Performed at [facility]   Report Status 04/22/2013 FINAL   Final  CULTURE, RESPIRATORY (NON-EXPECTORATED)     Status: None   Collection Time    04/16/13 10:13 AM      Result Value Range Status   Specimen  Description TRACHEAL ASPIRATE   Final   Special Requests Normal   Final   Gram Stain     Final   Value: MODERATE WBC PRESENT, PREDOMINANTLY PMN     RARE SQUAMOUS EPITHELIAL CELLS PRESENT     RARE GRAM NEGATIVE RODS     RARE GRAM POSITIVE RODS     Performed at [facility]   Culture     Final   Value: MODERATE KLEBSIELLA PNEUMONIAE     Note: Two isolates with different morphologies were identified as the same organism.The most resistant organism was reported.     Performed at [facility]   Report Status 04/19/2013 FINAL   Final   Organism ID, Bacteria KLEBSIELLA PNEUMONIAE   Final  CULTURE, BAL-QUANTITATIVE     Status: None   Collection Time    04/20/13  9:47 AM      Result Value Range Status   Specimen Description BRONCHIAL ALVEOLAR LAVAGE   Final   Special Requests NONE   Final   Gram Stain     Final   Value: MODERATE WBC PRESENT, PREDOMINANTLY PMN     NO SQUAMOUS EPITHELIAL CELLS SEEN     NO ORGANISMS SEEN     Performed at [facility] Count      Final   Value: 5,000 COLONIES/ML     Performed at [facility]   Culture     Final   Value: KLEBSIELLA PNEUMONIAE     Note: Two isolates with different morphologies were identified as the same organism.The most resistant organism was reported.     Performed at [facility]   Report Status 04/23/2013 FINAL   Final   Organism ID, Bacteria KLEBSIELLA PNEUMONIAE   Final  VIRAL CULTURE VIRC     Status: None   Collection Time    04/20/13  9:47 AM      Result Value Range Status   Specimen Description BRONCHIAL ALVEOLAR LAVAGE   Final   Special Requests NONE   Final   Culture     Final   Value: CONTINUING TO HOLD     Note:    The current Enterovirus culture system does not detect Enterovirus D68. If Enterovirus D68 is suspected, please call client services to add the test for Enterovirus PCR (Test code 16109).     Performed at [facility]   Report Status PENDING   Incomplete  RESPIRATORY VIRUS PANEL     Status: None   Collection Time    04/20/13  9:47 AM      Result Value Range Status   Source - RVPAN BRONCHIAL ALVEOLAR LAVAGE   Corrected   Comment: CORRECTED ON 12/21 AT 0151: PREVIOUSLY REPORTED AS BRONCHIAL ALVEOLAR LAVAGE   Respiratory Syncytial Virus A NOT DETECTED   Final   Respiratory Syncytial Virus B NOT DETECTED   Final   Influenza A NOT DETECTED   Final   Influenza B NOT DETECTED   Final   Parainfluenza 1 NOT DETECTED   Final   Parainfluenza 2 NOT DETECTED   Final   Parainfluenza 3 NOT DETECTED   Final   Metapneumovirus NOT DETECTED   Final   Rhinovirus NOT DETECTED   Final   Adenovirus NOT DETECTED   Final   Influenza A H1 NOT DETECTED   Final   Influenza A H3 NOT DETECTED   Final   Comment: (NOTE)           Normal  Reference Range for each Analyte: NOT DETECTED     Testing performed using the Lum[employer] xTAG Respiratory Viral Panel test     kit.     This test was developed and its performance characteristics determined     by [facility].  It has not been cleared or approved by the [place]     Food and Drug Administration. This test is used for clinical purposes.     It should not be regarded as investigational or for research. This     laboratory is certified under the Clinical Laboratory Improvement     Amendments of 1988 (CLIA) as qualified to perform high complexity     clinical laboratory testing.     Performed at [facility]    Anti-infectives   Start     Dose/Rate Route Frequency Ordered Stop   04/27/13 0600  vancomycin (VANCOCIN) 500 mg in sodium chloride 0.9 % 100 mL IVPB     500 mg 100 mL/hr over 60 Minutes Intravenous Every 24 hours 04/26/13 2337     04/24/13 2200  vancomycin (VANCOCIN) IVPB 750 mg/150 ml premix  Status:  Discontinued     750 mg 150 mL/hr over 60 Minutes Intravenous Every 24 hours 04/24/13 0911 04/26/13 2336   04/24/13 2200  ceFEPIme (MAXIPIME) 1 g in dextrose 5 % 50 mL IVPB     1 g 100 mL/hr over 30 Minutes Intravenous Every 24 hours 04/24/13 0911     04/23/13 1100  ceFEPIme (MAXIPIME) 1 g in dextrose 5 % 50 mL IVPB  Status:  Discontinued     1 g 100 mL/hr over 30 Minutes Intravenous Every 12 hours 04/23/13 1014 04/24/13 0911   04/20/13 2200  vancomycin (VANCOCIN) IVPB 750 mg/150 ml premix  Status:  Discontinued     750 mg 150 mL/hr over 60 Minutes Intravenous Every 12 hours 04/20/13 0903 04/24/13 0911   04/20/13 1200  imipenem-cilastatin (PRIMAXIN) 500 mg in sodium chloride 0.9 % 100 mL IVPB  Status:  Discontinued     500 mg 200 mL/hr over 30 Minutes Intravenous Every 8 hours 04/20/13 1009 04/23/13 0952   04/20/13 0930  vancomycin (VANCOCIN) IVPB 1000 mg/200 mL premix     1,000 mg 200 mL/hr over 60 Minutes Intravenous  Once 04/20/13 0903 04/20/13 1100   04/16/13 0600  vancomycin (VANCOCIN) IVPB 750 mg/150 ml premix  Status:  Discontinued     750 mg 150 mL/hr over 60 Minutes Intravenous Every 12 hours 05/01/2013 1400 04/17/13 0858   05/01/2013 2200  piperacillin-tazobactam (ZOSYN) IVPB  3.375 g  Status:  Discontinued     3.375 g 12.5 mL/hr over 240 Minutes Intravenous 3 times per day 04/13/2013 1400 04/20/13 0947   04/13/2013 1530  vancomycin (VANCOCIN) 1,406 mg in sodium chloride 0.9 % 500 mL IVPB  Status:  Discontinued     20 mg/kg  70.3 kg 250 mL/hr over 120 Minutes Intravenous  Once 04/22/2013 1525 04/22/2013 1525   04/20/2013 1530  piperacillin-tazobactam (ZOSYN) IVPB 3.375 g  Status:  Discontinued     3.375 g 100 mL/hr over 30 Minutes Intravenous  Once 04/06/2013 1525 04/14/2013 1525   04/13/2013 1430  [MAR Hold]  vancomycin (VANCOCIN) IVPB 1000 mg/200 mL premix     (On MAR Hold since 04/25/2013 1630)   1,000 mg 200 mL/hr over 60 Minutes Intravenous STAT 04/12/2013 1358 04/14/2013 1632   04/09/2013 1430  piperacillin-tazobactam (ZOSYN) IVPB 3.375 g     3.375 g 100 mL/hr over 30  Minutes Intravenous STAT 04/25/2013 1358 04/27/2013 1514      Assessment: Patient with high vancomycin trough.  Todays dose not given.  Goal of Therapy:  Vancomycin trough level 15-20 mcg/ml  Plan:  Measure antibiotic drug levels at steady state Follow up culture results Change to vancomycin 500mg  iv q24hr  Aleene Davidson Crowford 04/26/2013,11:38 PM

## 2013-04-26 NOTE — Progress Notes (Signed)
ANTICOAGULATION CONSULT NOTE - Follow Up Consult  Pharmacy Consult for heparin Indication: atrial fibrillation  Allergies  Allergen Reactions  . Vicodin [Hydrocodone-Acetaminophen] Nausea And Vomiting    Patient Measurements: Height: 5\' 11"  (180.3 cm) Weight: 169 lb 12.1 oz (77 kg) IBW/kg (Calculated) : 75.3 Heparin Dosing Weight: 79.2kg  Vital Signs: Temp: 99.5 F (37.5 C) (12/24 0400) Temp src: Oral (12/24 0400) BP: 137/56 mmHg (12/24 0700) Pulse Rate: 89 (12/24 0700)  Labs:  Recent Labs  04/24/13 0400 04/24/13 1255  04/25/13 0500  04/25/13 1625 04/26/13 0343 04/26/13 0730  HGB 8.3*  --   --  8.3*  --   --  8.7*  --   HCT 26.6*  --   --  25.8*  --   --  26.4*  --   PLT 140*  --   --  179  --   --  237  --   APTT  --  32  --   --   --   --   --   --   LABPROT  --  15.0  --   --   --   --   --   --   INR  --  1.21  --   --   --   --   --   --   HEPARINUNFRC  --   --   < > 0.19*  < > 0.29* 0.19* 0.26*  CREATININE 2.24*  --   --  2.28*  --   --  2.15*  --   < > = values in this interval not displayed.  Estimated Creatinine Clearance: 26.8 ml/min (by C-G formula based on Cr of 2.15).   Assessment: 79 yo male s/p emergent repair of incarcerated femoral hernia on 12/13, post-op course complicated by respiratory failure, septic shock, acute renal failure and new onset afib. Failed cardioversion, but converted to NSR on amiodarone which was dc'd 12/16. 12/22, back into afib and IV amiodarone resumed. Heparin gtt started for stroke prevention.    Heparin level slightly subtherapeutic on 1600 units/hr  CBC low but stable,  platelets now WNL (trending up)  No bleeding reported  Goal of Therapy:  Heparin level 0.3-0.7 units/ml Monitor platelets by anticoagulation protocol: Yes   Plan:   Increase heparin 1700 units/hr  Recheck heparin level in 8hr  Continue daily heparin level and CBC  Loralee Pacas, PharmD, BCPS Pager: [phone] 04/26/2013 8:34  AM

## 2013-04-26 NOTE — Progress Notes (Signed)
Name: Casey Davila MRN: [id number] DOB: 01/30/1926    ADMISSION DATE:  05/03/2013 CONSULTATION DATE:  04/14/2013  REFERRING MD :  Romie Levee, MD PRIMARY SERVICE: Surgery  CHIEF COMPLAINT:   Post repair of incarcerated femoral hernia. Out of the OR intubated.   BRIEF PATIENT DESCRIPTION:  79 years old male with PMH relevant for HTN.  Underwent emergent repair of incarcerated hernia and now out of the OR intubated in the ICU. CCM consult called to assist with ventilator management. . Admitted 04/14/2013 10:12 AM  LINES / TUBES: 12/14 L IJ CVL >> 12/13 ETT >>  CULTURES: 12/13 - Urine - nega 12/14 resp: pan sens Klebsiella PNA  12/14 blood >> Klebsiella ................................ 12/18 BAL>>>GNR 04/20/13 - BAL Resp Virus PCR - NEGATIVE  ANTIBIOTICS: 12/13 vanc >>12/15 12/13 zosyn >>12/18 .................... 12/18 primaxin>>>12/21 12/18 vanc>>> .......... 04/23/13 - cefepime>>>  SIGNIFICANT EVENTS / STUDIES:  12/14 CT angio chest/ab/pelv> esophagus full of fluid? (thick), nearly occludes left mainstem with external compression; left lower lung pulm nodule; aspiration pneumonitis bilateral base, SBO, R femoral hernia 12/14 afib with rvr/shock, attempted cardioversion x3 unsuccessful 12/18 FOB, copious thick secretions 04/22/13 - 70% fio2/10 peep. Agitated on WUA.  Not making progress. Stil febrile 12/22 a-fib with RVR, amiodarone started. 12/23 Lasix drip with great response.  SUBJECTIVE/OVERNIGHT/INTERVAL HX Sporadic episodes of a-fib with RVR.  VITAL SIGNS: Temp:  [98.4 F (36.9 C)-99.5 F (37.5 C)] 99.5 F (37.5 C) (12/24 0400) Pulse Rate:  [54-152] 81 (12/24 0800) Resp:  [0-33] 18 (12/24 0800) BP: (74-137)/(27-57) 107/41 mmHg (12/24 0800) SpO2:  [90 %-96 %] 94 % (12/24 0800) FiO2 (%):  [40 %-50 %] 40 % (12/24 0908) Weight:  [77 kg (169 lb 12.1 oz)] 77 kg (169 lb 12.1 oz) (12/24 0315)  HEMODYNAMICS: CVP:  [7 mmHg] 7 mmHg  VENTILATOR SETTINGS: Vent  Mode:  [-] CPAP FiO2 (%):  [40 %-50 %] 40 % Set Rate:  [18 bmp] 18 bmp Vt Set:  [450 mL] 450 mL PEEP:  [5 cmH20] 5 cmH20 Pressure Support:  [10 cmH20] 10 cmH20 Plateau Pressure:  [17 cmH20-20 cmH20] 17 cmH20  INTAKE / OUTPUT: Intake/Output     12/23 0701 - 12/24 0700 12/24 0701 - 12/25 0700   I.V. (mL/kg) 1752.1 (22.8) 73.5 (1)   NG/GT 1270 30   IV Piggyback 300 50   Total Intake(mL/kg) 3322.1 (43.1) 153.5 (2)   Urine (mL/kg/hr) 5685 (3.1) 350 (1.6)   Total Output 5685 350   Net -2362.9 -196.5        Stool Occurrence 3 x     PHYSICAL EXAMINATION: General: Comfortable on vent, weaning. HEENT: NCAT, ETT in place PULM: Decreased t/o CV: RRR, no mgr, NSR on tele  AB: BS noted, non-tender Ext: warm, no edema, SCD's Neuro: arouses easily, follows commands  LABS: PULMONARY  Recent Labs Lab 04/23/13 0412 04/24/13 1250 04/25/13 0440 04/26/13 0402  PHART 7.277* 7.326* 7.341* 7.356  PCO2ART 70.8* 60.1* 59.9* 66.0*  PO2ART 88.4 64.8* 66.9* 71.1*  HCO3 31.3* 30.5* 31.3* 35.8*  TCO2 29.9 29.4 29.9 33.9  O2SAT 95.2 91.3 90.6 91.9   CBC  Recent Labs Lab 04/24/13 0400 04/25/13 0500 04/26/13 0343  HGB 8.3* 8.3* 8.7*  HCT 26.6* 25.8* 26.4*  WBC 16.0* 13.9* 12.8*  PLT 140* 179 237   COAGULATION  Recent Labs Lab 04/24/13 1255  INR 1.21    CARDIAC  No results found for this basename: TROPONINI,  in the last 168 hours  Recent Labs Lab 04/23/13  0500  PROBNP 821.7*   CHEMISTRY  Recent Labs Lab 04/20/13 1405  04/22/13 0330 04/23/13 0500 04/24/13 0400 04/25/13 0500 04/26/13 0343  NA 150*  < > 148* 154* 150* 148* 147*  K 3.8  < > 4.0 4.5 4.6 4.1 3.0*  CL 113*  < > 112 118* 112 110 104  CO2 30  < > 30 32 30 31 36*  GLUCOSE 147*  < > 130* 132* 119* 142* 152*  BUN 48*  < > 51* 58* 83* 94* 91*  CREATININE 1.09  < > 1.05 1.37* 2.24* 2.28* 2.15*  CALCIUM 8.8  < > 9.0 9.0 8.7 8.5 8.5  MG 2.0  --   --  2.2 2.4 2.3 2.1  PHOS  --   --   --  3.4 5.1* 4.5 4.9*  <  > = values in this interval not displayed. Estimated Creatinine Clearance: 26.8 ml/min (by C-G formula based on Cr of 2.15).  LIVER  Recent Labs Lab 04/21/13 0500 04/22/13 0330 04/24/13 1255  AST 24 34  --   ALT 14 18  --   ALKPHOS 111 115  --   BILITOT 3.0* 2.9*  --   PROT 5.1* 4.9*  --   ALBUMIN 1.6* 1.4*  --   INR  --   --  1.21   INFECTIOUS  Recent Labs Lab 04/20/13 0400 04/21/13 0500 04/22/13 0330 04/24/13 0545  LATICACIDVEN  --   --   --  1.0  PROCALCITON 4.44 2.75 1.90  --    ENDOCRINE CBG (last 3)   Recent Labs  04/26/13 0014 04/26/13 0323 04/26/13 0748  GLUCAP 154* 138* 152*   IMAGING x48h  Dg Chest Port 1 View  04/26/2013   CLINICAL DATA:  Intubation.  EXAM: PORTABLE CHEST - 1 VIEW  COMPARISON:  04/25/2013 .  FINDINGS: Endotracheal tube, left IJ line, NG tube in stable position. Dense pulmonary infiltrates bilaterally remain. Pulmonary infiltrates improved slightly from prior exam. Heart size and pulmonary vascularity stable. Bilateral pleural effusions have almost completely cleared. No pneumothorax. No acute osseous abnormality.  IMPRESSION: 1. Stable line and tube positions. 2. Partial clearing of dense bilateral pulmonary infiltrates. Near complete clearing of bilateral pleural effusions.   Electronically Signed   By: Maisie Fus  Register   On: 04/26/2013 07:37   Dg Chest Port 1 View  04/25/2013   CLINICAL DATA:  Intubation.  EXAM: PORTABLE CHEST - 1 VIEW  COMPARISON:  04/25/2013.  FINDINGS: Endotracheal tube, left IJ line in stable position. NG tube now noted below left hemidiaphragm. Heart size and pulmonary vascularity stable. Dense bilateral pulmonary alveolar infiltrates are present. Bilateral pleural effusions are present. No pneumothorax. No acute osseous abnormality.  IMPRESSION: 1. NG tube now noted with tip below the left hemidiaphragm. Central line and endotracheal tube in stable position. 2. Dense bilateral pulmonary infiltrates particularly on the  right. Associated small bilateral effusions. Congestive heart failure with pulmonary edema could present in this fashion. Other etiologies of pulmonary infiltrates including pneumonia should also be considered.   Electronically Signed   By: Maisie Fus  Register   On: 04/25/2013 08:01   ASSESSMENT / PLAN:  PULMONARY A: 1) Acute hypoxemic respiratory failure due to aspiration pneumonitis/ ARDS   04/22/13  - Persistent ARDS. Net 3L positive past 7 days 12./21/14 - Improved to 50% fio2/peep 10 12/22 improved PEEP 5 and FiO2 of 40%. P:   - Begin PS trials but no extubation given WOB. - See ID sectional. - CXR and ABG  in AM. - Diureses as ordered.  CARDIOVASCULAR A:  1) Septic shock > resolved 2) New onset afib with RVR due to sepsis and levophed, failed cardioversion; converted with amiodarone, Now NSR w/ occ PAC, had Run of SVT 12/19 04/23/13 - sinus tach P:  - PRN lopressor. - KVO IVFs. - Heparin drip per pharmacy and will need coumadin as outpatient. - Amiodarone drip as ordered.  RENAL A:   Acute renal failure> resolved Hypernatremia s/p diuresis , and worsening Also 3rd spacing  P:   - Free water. - F/U chem. - Lasix drip as ordered for 24hours at 10 mg/hr again. - Zaroxolyn as ordered. - Diamox x2 doses as ordered. - Replace electrolytes as needed. - K-dur x2 doses.  GASTROINTESTINAL A:   1) R femoral hernia and SBO, viable bowel; Status post surgical repair  04/23/13 - residuals + but < 400cc P:   - GI prophylaxis with protonix. - Advance TF to goal.  HEMATOLOGIC A:   ANemia of critical illness Mild thrombocytopenia (dropped 50%) due to illness: Plts holding in the 70-80 range. Changed to LMWH on 12/16.  HTT negative 12/16//14  P:  - PRBC for hgb </= 6.9gm%    - exceptions are   -  if ACS susepcted/confirmed then transfuse for hgb </= 8.0gm%,  or    - active bleeding with hemodynamic instability, then transfuse regardless of hemoglobin value   At at all  times try to transfuse 1 unit prbc as possible with exception of active hemorrhage  INFECTIOUS A:   1) Aspiration PNA + Klebsiella PNA (Pan sens)   04/23/13  - fever curve worse despite change to primaxin on 04/20/13  P:   - Changed primaxin to cefepime. - Continue vanc. - See above, f/u culture data narrow when able  - Hold off starting anti-fungal for now.  ENDOCRINE A:   1) No issues    NEUROLOGIC A:   1) Intubated, sedated  04/23/13 - Agiated on WUA  P:   - Sedation and pain management with fentanyl - RASS score goal of -1  Global:  Tolerated diureses well, renal function holding, will continue diureses.  Spoke with wife today, once extubated is no longer to be intubated, no trach/peg/dialysis.  If renal function deteriorates and stops responding to diuretics then comfort care.  The patient is critically ill with multiple organ systems failure and requires high complexity decision making for assessment and support, frequent evaluation and titration of therapies, application of advanced monitoring technologies and extensive interpretation of multiple databases.   Critical Care Time devoted to patient care services described in this note is 35 minutes.  Alyson Reedy, M.D. [facility] Pulmonary/Critical Care Medicine. Pager: [phone]. After hours pager: [phone].

## 2013-04-26 NOTE — Progress Notes (Signed)
Wasted Fentanyl in sink with Eleonore Chiquito.

## 2013-04-26 NOTE — Progress Notes (Signed)
NUTRITION FOLLOW UP  Intervention:   - Recommend goal rate of Oxepa at 45 ml/hr with Prostat 30 ml daily which will provide 1720 calories, 83 grams protein- will meet 98% estimated calorie needs, 92% estimated protein needs - Will continue to monitor   Nutrition Dx:   Inadequate oral intake related to inability to eat as evidenced by NPO - ongoing   Goal:   Initiation of TF with goal to meet >90% of estimated nutritional needs - TF initiation met, not at goal rate yet  Monitor:   Weights, labs, vent status, TF initiation/tolerance/advancement, BM  Assessment:   Admitted with abdominal pain and constipation x 3 days with nausea. Found to have small bowel obstruction with incarcerated femoral hernia which was surgically repaired 04/29/2013. During intubation for surgery he was found to have gastric content in his vocal cords. Had cardioversion yesterday for new onset A-fib with RVR in the setting of shock.  12/17 - RD consulted for TF mgt  12/23 Per RN Patrica Duel is running @ 40 ml/hr with goal rate of 65 ml/hr. Oxepa @ 65 ml/hr would provide 2340 kcal, 98 grams of protein, and 1225 ml of free water- this would provide 133% of estimated energy needs. Will adjust TF goal rate. Last BM 12/23. Pt tolerating Oxepa @ 40 ml per hour; residual only 20 ml this AM.   Patient is currently intubated on ventilator support.  MV: 9.3 L/min Temp (24hrs), Avg:99 F (37.2 C), Min:98.4 F (36.9 C), Max:99.5 F (37.5 C)  Propofol: off  Sodium elevated BUN/Cr elevated with low GFR  Height: Ht Readings from Last 1 Encounters:  04/24/13 5\' 11"  (1.803 m)    Weight Status:   Wt Readings from Last 1 Encounters:  04/26/13 169 lb 12.1 oz (77 kg)  Admit wt:        155 lb (70.3 kg)    Re-estimated needs:  Kcal: 1762 Protein: 90-100 grams Fluid: >1.9 L/day  Skin: +3 generalized edema; +3 RUE, LUE, RLE and LLE edema; abdominal incision   Diet Order: NPO   Intake/Output Summary (Last 24 hours) at  04/26/13 1324 Last data filed at 04/26/13 1212  Gross per 24 hour  Intake 3401.06 ml  Output   6525 ml  Net -3123.94 ml    Last BM: 12/23   Labs:   Recent Labs Lab 04/24/13 0400 04/25/13 0500 04/26/13 0343  NA 150* 148* 147*  K 4.6 4.1 3.0*  CL 112 110 104  CO2 30 31 36*  BUN 83* 94* 91*  CREATININE 2.24* 2.28* 2.15*  CALCIUM 8.7 8.5 8.5  MG 2.4 2.3 2.1  PHOS 5.1* 4.5 4.9*  GLUCOSE 119* 142* 152*    CBG (last 3)   Recent Labs  04/26/13 0323 04/26/13 0748 04/26/13 1159  GLUCAP 138* 152* 139*    Scheduled Meds: . acetaZOLAMIDE  250 mg Intravenous Q8H  . antiseptic oral rinse  1 application Mouth Rinse QID  . bisacodyl  10 mg Rectal Daily  . ceFEPime (MAXIPIME) IV  1 g Intravenous Q24H  . chlorhexidine  15 mL Mouth/Throat BID  . [START ON 04/27/2013] feeding supplement (OXEPA)  1,000 mL Per Tube Q24H  . free water  250 mL Per Tube Q6H  . pantoprazole sodium  40 mg Per Tube Daily  . potassium chloride  40 mEq Per Tube TID  . vancomycin  750 mg Intravenous Q24H    Continuous Infusions: . sodium chloride 20 mL/hr at 04/26/13 0155  . amiodarone (NEXTERONE PREMIX) 360  mg/200 mL dextrose 60 mg/hr (04/26/13 0747)  . fentaNYL infusion INTRAVENOUS 100 mcg/hr (04/26/13 0957)  . furosemide (LASIX) infusion    . heparin 1,700 Units/hr (04/26/13 0830)    Levon Hedger MS, RD, LDN [phone] Pager [phone] After Hours Pager

## 2013-04-26 NOTE — Progress Notes (Signed)
11 Days Post-Op  Subjective: He will look up and shake his head no when you ask him if he hurts, but otherwise just acts completely fatigued.  Objective: Vital signs in last 24 hours: Temp:  [98.4 F (36.9 C)-99.5 F (37.5 C)] 99.5 F (37.5 C) (12/24 0400) Pulse Rate:  [54-152] 89 (12/24 0700) Resp:  [0-33] 24 (12/24 0700) BP: (74-137)/(27-59) 137/56 mmHg (12/24 0700) SpO2:  [90 %-100 %] 94 % (12/24 0700) FiO2 (%):  [40 %-50 %] 40 % (12/24 0700) Weight:  [77 kg (169 lb 12.1 oz)] 77 kg (169 lb 12.1 oz) (12/24 0315) Last BM Date: 04/25/13 3 stools recorded,  TF at 30 ml/hr 3 runs of KCL On Vent,  TM 99.5/BP holding with pressor,  K+ 3.0 being replaced. Creatinine slowly going up WBC is stable Film shows ongoing pulmonary infiltrates, bilateral improvement of pleural effusions Intake/Output from previous day: 12/23 0701 - 12/24 0700 In: 3332.1 [I.V.:1762.1; NG/GT:1270; IV Piggyback:300] Out: 5685 [Urine:5685] Intake/Output this shift: Total I/O In: 50 [IV Piggyback:50] Out: 350 [Urine:350]  General appearance: He is awake, just looks fatigued. Resp: On vent, some clear BS anterior chest GI: soft, non-tender; bowel sounds normal; no masses,  no organomegaly and RIH site looks fine.  Lab Results:   Recent Labs  04/25/13 0500 04/26/13 0343  WBC 13.9* 12.8*  HGB 8.3* 8.7*  HCT 25.8* 26.4*  PLT 179 237    BMET  Recent Labs  04/25/13 0500 04/26/13 0343  NA 148* 147*  K 4.1 3.0*  CL 110 104  CO2 31 36*  GLUCOSE 142* 152*  BUN 94* 91*  CREATININE 2.28* 2.15*  CALCIUM 8.5 8.5   PT/INR  Recent Labs  04/24/13 1255  LABPROT 15.0  INR 1.21     Recent Labs Lab 04/21/13 0500 04/22/13 0330  AST 24 34  ALT 14 18  ALKPHOS 111 115  BILITOT 3.0* 2.9*  PROT 5.1* 4.9*  ALBUMIN 1.6* 1.4*     Lipase  No results found for this basename: lipase     Studies/Results: Dg Chest Port 1 View  04/26/2013   CLINICAL DATA:  Intubation.  EXAM: PORTABLE CHEST -  1 VIEW  COMPARISON:  04/25/2013 .  FINDINGS: Endotracheal tube, left IJ line, NG tube in stable position. Dense pulmonary infiltrates bilaterally remain. Pulmonary infiltrates improved slightly from prior exam. Heart size and pulmonary vascularity stable. Bilateral pleural effusions have almost completely cleared. No pneumothorax. No acute osseous abnormality.  IMPRESSION: 1. Stable line and tube positions. 2. Partial clearing of dense bilateral pulmonary infiltrates. Near complete clearing of bilateral pleural effusions.   Electronically Signed   By: Maisie Fus  Register   On: 04/26/2013 07:37   Dg Chest Port 1 View  04/25/2013   CLINICAL DATA:  Intubation.  EXAM: PORTABLE CHEST - 1 VIEW  COMPARISON:  04/25/2013.  FINDINGS: Endotracheal tube, left IJ line in stable position. NG tube now noted below left hemidiaphragm. Heart size and pulmonary vascularity stable. Dense bilateral pulmonary alveolar infiltrates are present. Bilateral pleural effusions are present. No pneumothorax. No acute osseous abnormality.  IMPRESSION: 1. NG tube now noted with tip below the left hemidiaphragm. Central line and endotracheal tube in stable position. 2. Dense bilateral pulmonary infiltrates particularly on the right. Associated small bilateral effusions. Congestive heart failure with pulmonary edema could present in this fashion. Other etiologies of pulmonary infiltrates including pneumonia should also be considered.   Electronically Signed   By: Maisie Fus  Register   On: 04/25/2013 08:01  Dg Abd Portable 1v  04/24/2013   CLINICAL DATA:  Nasogastric tube placement at bedside.  EXAM: PORTABLE ABDOMEN - 1 VIEW  COMPARISON:  04/19/2013.  FINDINGS: Nasogastric tube tip overlies proximal body of the stomach. Bowel gas pattern unremarkable without evidence of obstruction or significant ileus. Large stool burden in the colon.  IMPRESSION: 1. Nasogastric tube tip overlies the proximal body of the stomach. 2. No acute abdominal  abnormality.  Large stool burden.   Electronically Signed   By: Hulan Saas M.D.   On: 04/24/2013 08:49    Medications: . antiseptic oral rinse  1 application Mouth Rinse QID  . bisacodyl  10 mg Rectal Daily  . ceFEPime (MAXIPIME) IV  1 g Intravenous Q24H  . chlorhexidine  15 mL Mouth/Throat BID  . feeding supplement (OXEPA)  1,000 mL Per Tube Q24H  . free water  250 mL Per Tube Q6H  . pantoprazole sodium  40 mg Per Tube Daily  . potassium chloride  10 mEq Intravenous Q1 Hr x 6  . vancomycin  750 mg Intravenous Q24H   . sodium chloride 20 mL/hr at 04/26/13 0155  . amiodarone (NEXTERONE PREMIX) 360 mg/200 mL dextrose 60 mg/hr (04/26/13 0747)  . fentaNYL infusion INTRAVENOUS 150 mcg/hr (04/25/13 2300)  . furosemide (LASIX) infusion 10 mg/hr (04/26/13 0105)  . heparin 1,600 Units/hr (04/26/13 0508)    Assessment/Plan 1. POD 11, s/p open repair of right femoral hernia - A. Thomas 04/19/2013 2. SBO, resolved with #1  3. Post op ileus, resolving  4. A fib with RVR, converted on amio  5. VDRF secondary to aspiration pneumonitis/ ARDS 6. Leukocytosis  7. PCM/TF   Plan:  Continue supportive care.   LOS: 11 days    Marina Boerner 04/26/2013

## 2013-04-27 ENCOUNTER — Inpatient Hospital Stay (HOSPITAL_COMMUNITY): Payer: Medicare Other

## 2013-04-27 LAB — BLOOD GAS, ARTERIAL
Acid-Base Excess: 11.3 mmol/L — ABNORMAL HIGH (ref 0.0–2.0)
Drawn by: 11249
FIO2: 0.4 %
MECHVT: 450 mL
O2 Saturation: 96 %
Patient temperature: 98.6
RATE: 18 resp/min
TCO2: 36.9 mmol/L (ref 0–100)
pH, Arterial: 7.335 — ABNORMAL LOW (ref 7.350–7.450)

## 2013-04-27 LAB — CBC WITH DIFFERENTIAL/PLATELET
Basophils Relative: 0 % (ref 0–1)
Eosinophils Absolute: 0.1 10*3/uL (ref 0.0–0.7)
Eosinophils Relative: 1 % (ref 0–5)
HCT: 26.4 % — ABNORMAL LOW (ref 39.0–52.0)
Hemoglobin: 8.4 g/dL — ABNORMAL LOW (ref 13.0–17.0)
Lymphocytes Relative: 9 % — ABNORMAL LOW (ref 12–46)
MCH: 26.8 pg (ref 26.0–34.0)
MCHC: 31.8 g/dL (ref 30.0–36.0)
MCV: 84.1 fL (ref 78.0–100.0)
Monocytes Absolute: 0.6 10*3/uL (ref 0.1–1.0)
Monocytes Relative: 5 % (ref 3–12)
RBC: 3.14 MIL/uL — ABNORMAL LOW (ref 4.22–5.81)
WBC: 13.7 10*3/uL — ABNORMAL HIGH (ref 4.0–10.5)

## 2013-04-27 LAB — BASIC METABOLIC PANEL
BUN: 90 mg/dL — ABNORMAL HIGH (ref 6–23)
CO2: 38 mEq/L — ABNORMAL HIGH (ref 19–32)
Chloride: 101 mEq/L (ref 96–112)
GFR calc non Af Amer: 27 mL/min — ABNORMAL LOW (ref >90)
Glucose, Bld: 138 mg/dL — ABNORMAL HIGH (ref 70–99)
Potassium: 3.2 mEq/L — ABNORMAL LOW (ref 3.5–5.1)

## 2013-04-27 LAB — GLUCOSE, CAPILLARY
Glucose-Capillary: 140 mg/dL — ABNORMAL HIGH (ref 70–99)
Glucose-Capillary: 159 mg/dL — ABNORMAL HIGH (ref 70–99)
Glucose-Capillary: 141 mg/dL — ABNORMAL HIGH (ref 70–99)
Glucose-Capillary: 150 mg/dL — ABNORMAL HIGH (ref 70–99)

## 2013-04-27 LAB — PHOSPHORUS: Phosphorus: 4.9 mg/dL — ABNORMAL HIGH (ref 2.3–4.6)

## 2013-04-27 MED ORDER — POTASSIUM CHLORIDE 20 MEQ/15ML (10%) PO LIQD
40.0000 meq | Freq: Three times a day (TID) | ORAL | Status: AC
  Administered 2013-04-27 (×2): 40 meq
  Filled 2013-04-27 (×2): qty 30

## 2013-04-27 MED ORDER — HEPARIN (PORCINE) IN NACL 100-0.45 UNIT/ML-% IJ SOLN
1950.0000 [IU]/h | INTRAMUSCULAR | Status: DC
  Administered 2013-04-28: 1950 [IU]/h via INTRAVENOUS
  Filled 2013-04-27 (×4): qty 250

## 2013-04-27 MED ORDER — HEPARIN (PORCINE) IN NACL 100-0.45 UNIT/ML-% IJ SOLN
1850.0000 [IU]/h | INTRAMUSCULAR | Status: DC
  Administered 2013-04-27: 1850 [IU]/h via INTRAVENOUS
  Filled 2013-04-27 (×2): qty 250

## 2013-04-27 NOTE — Progress Notes (Signed)
ANTICOAGULATION CONSULT NOTE - Follow Up Consult  Pharmacy Consult for Heparin Indication: atrial fibrillation  Allergies  Allergen Reactions  . Vicodin [Hydrocodone-Acetaminophen] Nausea And Vomiting    Patient Measurements: Height: 5\' 11"  (180.3 cm) Weight: 167 lb 15.9 oz (76.2 kg) IBW/kg (Calculated) : 75.3 Heparin Dosing Weight: 76.2kg  Vital Signs: Temp: 98.2 F (36.8 C) (12/25 1200) Temp src: Axillary (12/25 1200) BP: 121/46 mmHg (12/25 1400) Pulse Rate: 73 (12/25 1400)  Labs:  Recent Labs  04/25/13 0500  04/26/13 0343  04/26/13 1400 04/26/13 1700 04/27/13 0405 04/27/13 1542  HGB 8.3*  --  8.7*  --   --   --  8.4*  --   HCT 25.8*  --  26.4*  --   --   --  26.4*  --   PLT 179  --  237  --   --   --  294  --   HEPARINUNFRC 0.19*  < > 0.19*  < >  --  0.34 0.15* 0.29*  CREATININE 2.28*  --  2.15*  --  2.09*  --  2.10*  --   < > = values in this interval not displayed.  Estimated Creatinine Clearance: 27.4 ml/min (by C-G formula based on Cr of 2.1).   Medications:  Infusions:  . sodium chloride 10 mL/hr (04/26/13 1900)  . amiodarone (NEXTERONE PREMIX) 360 mg/200 mL dextrose 30.06 mg/hr (04/27/13 1000)  . fentaNYL infusion INTRAVENOUS 125 mcg/hr (04/27/13 1347)  . heparin 1,850 Units/hr (04/27/13 1100)    Assessment: 79 yo male s/p emergent repair of incarcerated femoral hernia on 12/13, post-op course complicated by respiratory failure, septic shock, acute renal failure and new onset afib. Failed cardioversion, but converted to NSR on amiodarone which was dc'd 12/16. Today, 12/22, back into afib and IV amiodarone resumed. Heparin gtt started for stroke prevention.    Heparin level = 0.29 following rate adjustment this morning for low heparin level, current rate 1850 units/hr.  Level drawn peripherally per RN    CBC from this am: Hgb stable, platelets WNL  No obvious bleeding per RN  Goal of Therapy:  Heparin level 0.3-0.7 units/ml Monitor platelets by  anticoagulation protocol: Yes   Plan:   Increase heparin to 1950 units/hr  Recheck heparin level in ~8h (will check with am labs)  Juliette Alcide, PharmD, BCPS.   Pager: [phone]  04/27/2013,4:41 PM

## 2013-04-27 NOTE — Progress Notes (Signed)
Patient ID: Casey Davila, male   DOB: Oct 14, 1925, 79 y.o.   MRN: [id number] 12 Days Post-Op  Subjective: Follows simple commands on vent  Objective: Vital signs in last 24 hours: Temp:  [97.1 F (36.2 C)-99 F (37.2 C)] 98.3 F (36.8 C) (12/25 0800) Pulse Rate:  [59-104] 104 (12/25 0600) Resp:  [16-26] 16 (12/25 0600) BP: (83-144)/(34-80) 91/38 mmHg (12/25 0939) SpO2:  [87 %-99 %] 98 % (12/25 0600) FiO2 (%):  [40 %] 40 % (12/25 0939) Weight:  [167 lb 15.9 oz (76.2 kg)] 167 lb 15.9 oz (76.2 kg) (12/25 0533) Last BM Date: 04/26/13  Intake/Output from previous day: 12/24 0701 - 12/25 0700 In: 3952.7 [I.V.:1707.7; NG/GT:1895; IV Piggyback:350] Out: 5170 [Urine:5170] Intake/Output this shift: Total I/O In: -  Out: 400 [Urine:400]  General appearance: fatigued and chronically ill appearing GI: Mild distention, soft and non tender Incision/Wound: Clean and dry  Lab Results:   Recent Labs  04/26/13 0343 04/27/13 0405  WBC 12.8* 13.7*  HGB 8.7* 8.4*  HCT 26.4* 26.4*  PLT 237 294   BMET  Recent Labs  04/26/13 1400 04/27/13 0405  NA 149* 147*  K 4.1 3.2*  CL 103 101  CO2 38* 38*  GLUCOSE 158* 138*  BUN 88* 90*  CREATININE 2.09* 2.10*  CALCIUM 8.5 8.6     Studies/Results: Dg Chest Port 1 View  04/27/2013   CLINICAL DATA:  Evaluate endotracheal tube position.  EXAM: PORTABLE CHEST - 1 VIEW  COMPARISON:  04/26/2013  IMPRESSION: Support apparatus is stable and well positioned.  Bilateral lung opacities are unchanged from the previous day's study.  FINDINGS:A: FINDINGS:A Endotracheal tube tip lies 3 cm above the carina. Left internal jugular central venous line tip lies in the mid superior vena cava. Enteric tube passes below the diaphragm.  Patchy bilateral airspace lung opacities with associated coarse interstitial densities are stable suggesting multifocal pneumonia.  No pneumothorax.   Electronically Signed   By: Amie Portland M.D.   On: 04/27/2013 07:41   Dg  Chest Port 1 View  04/26/2013   CLINICAL DATA:  Intubation.  EXAM: PORTABLE CHEST - 1 VIEW  COMPARISON:  04/25/2013 .  FINDINGS: Endotracheal tube, left IJ line, NG tube in stable position. Dense pulmonary infiltrates bilaterally remain. Pulmonary infiltrates improved slightly from prior exam. Heart size and pulmonary vascularity stable. Bilateral pleural effusions have almost completely cleared. No pneumothorax. No acute osseous abnormality.  IMPRESSION: 1. Stable line and tube positions. 2. Partial clearing of dense bilateral pulmonary infiltrates. Near complete clearing of bilateral pleural effusions.   Electronically Signed   By: Maisie Fus  Register   On: 04/26/2013 07:37    Anti-infectives: Anti-infectives   Start     Dose/Rate Route Frequency Ordered Stop   04/27/13 0600  vancomycin (VANCOCIN) 500 mg in sodium chloride 0.9 % 100 mL IVPB     500 mg 100 mL/hr over 60 Minutes Intravenous Every 24 hours 04/26/13 2337     04/24/13 2200  vancomycin (VANCOCIN) IVPB 750 mg/150 ml premix  Status:  Discontinued     750 mg 150 mL/hr over 60 Minutes Intravenous Every 24 hours 04/24/13 0911 04/26/13 2336   04/24/13 2200  ceFEPIme (MAXIPIME) 1 g in dextrose 5 % 50 mL IVPB     1 g 100 mL/hr over 30 Minutes Intravenous Every 24 hours 04/24/13 0911     04/23/13 1100  ceFEPIme (MAXIPIME) 1 g in dextrose 5 % 50 mL IVPB  Status:  Discontinued  1 g 100 mL/hr over 30 Minutes Intravenous Every 12 hours 04/23/13 1014 04/24/13 0911   04/20/13 2200  vancomycin (VANCOCIN) IVPB 750 mg/150 ml premix  Status:  Discontinued     750 mg 150 mL/hr over 60 Minutes Intravenous Every 12 hours 04/20/13 0903 04/24/13 0911   04/20/13 1200  imipenem-cilastatin (PRIMAXIN) 500 mg in sodium chloride 0.9 % 100 mL IVPB  Status:  Discontinued     500 mg 200 mL/hr over 30 Minutes Intravenous Every 8 hours 04/20/13 1009 04/23/13 0952   04/20/13 0930  vancomycin (VANCOCIN) IVPB 1000 mg/200 mL premix     1,000 mg 200 mL/hr over 60  Minutes Intravenous  Once 04/20/13 0903 04/20/13 1100   04/16/13 0600  vancomycin (VANCOCIN) IVPB 750 mg/150 ml premix  Status:  Discontinued     750 mg 150 mL/hr over 60 Minutes Intravenous Every 12 hours 04-21-13 1400 04/17/13 0858   04-21-13 2200  piperacillin-tazobactam (ZOSYN) IVPB 3.375 g  Status:  Discontinued     3.375 g 12.5 mL/hr over 240 Minutes Intravenous 3 times per day 2013-04-21 1400 04/20/13 0947   04/21/13 1530  vancomycin (VANCOCIN) 1,406 mg in sodium chloride 0.9 % 500 mL IVPB  Status:  Discontinued     20 mg/kg  70.3 kg 250 mL/hr over 120 Minutes Intravenous  Once 04/21/2013 1525 04/21/13 1525   04/21/13 1530  piperacillin-tazobactam (ZOSYN) IVPB 3.375 g  Status:  Discontinued     3.375 g 100 mL/hr over 30 Minutes Intravenous  Once 04/21/2013 1525 21-Apr-2013 1525   21-Apr-2013 1430  [MAR Hold]  vancomycin (VANCOCIN) IVPB 1000 mg/200 mL premix     (On MAR Hold since 04-21-13 1630)   1,000 mg 200 mL/hr over 60 Minutes Intravenous STAT April 21, 2013 1358 2013-04-21 1632   April 21, 2013 1430  piperacillin-tazobactam (ZOSYN) IVPB 3.375 g     3.375 g 100 mL/hr over 30 Minutes Intravenous STAT 04-21-13 1358 04/21/2013 1514      Assessment/Plan: s/p Procedure(s): HERNIA REPAIR INGUINAL ADULT VDRF/PNA Tolerating TF Continue support    LOS: 12 days    Clemence Lengyel T 04/27/2013

## 2013-04-27 NOTE — Progress Notes (Signed)
ANTICOAGULATION CONSULT NOTE - Follow Up Consult  Pharmacy Consult for Heparin Indication: atrial fibrillation  Allergies  Allergen Reactions  . Vicodin [Hydrocodone-Acetaminophen] Nausea And Vomiting    Patient Measurements: Height: 5\' 11"  (180.3 cm) Weight: 167 lb 15.9 oz (76.2 kg) IBW/kg (Calculated) : 75.3 Heparin Dosing Weight:   Vital Signs: Temp: 99 F (37.2 C) (12/25 0343) Temp src: Axillary (12/25 0343) BP: 115/55 mmHg (12/25 0600) Pulse Rate: 104 (12/25 0600)  Labs:  Recent Labs  04/24/13 1255  04/25/13 0500  04/26/13 0343 04/26/13 0730 04/26/13 1400 04/26/13 1700 04/27/13 0405  HGB  --   < > 8.3*  --  8.7*  --   --   --  8.4*  HCT  --   --  25.8*  --  26.4*  --   --   --  26.4*  PLT  --   --  179  --  237  --   --   --  294  APTT 32  --   --   --   --   --   --   --   --   LABPROT 15.0  --   --   --   --   --   --   --   --   INR 1.21  --   --   --   --   --   --   --   --   HEPARINUNFRC  --   < > 0.19*  < > 0.19* 0.26*  --  0.34 0.15*  CREATININE  --   < > 2.28*  --  2.15*  --  2.09*  --  2.10*  < > = values in this interval not displayed.  Estimated Creatinine Clearance: 27.4 ml/min (by C-G formula based on Cr of 2.1).   Medications:  Infusions:  . sodium chloride 10 mL/hr (04/26/13 1900)  . amiodarone (NEXTERONE PREMIX) 360 mg/200 mL dextrose 30 mg/hr (04/26/13 2007)  . fentaNYL infusion INTRAVENOUS 125 mcg/hr (04/27/13 0205)  . furosemide (LASIX) infusion 10 mg/hr (04/27/13 0406)  . heparin 1,850 Units/hr (04/27/13 4098)    Assessment: Patient with low heparin level.  No issues per RN.  Goal of Therapy:  Heparin level 0.3-0.7 units/ml Monitor platelets by anticoagulation protocol: Yes   Plan:  Increase heparin to 1850 units/hr Recheck heparin level at 1500.  Darlina Guys, Jacquenette Shone [place] 04/27/2013,6:22 AM

## 2013-04-27 NOTE — Progress Notes (Signed)
eLink Physician-Brief Progress Note Patient Name: Casey Davila DOB: [birth year] MRN: [id number]  Date of Service  04/27/2013   HPI/Events of Note  Hypokalemia   eICU Interventions  Potassium replaced   Intervention Category Minor Interventions: Electrolytes abnormality - evaluation and management  DETERDING,ELIZABETH 04/27/2013, 6:22 AM

## 2013-04-27 NOTE — Progress Notes (Signed)
Name: Casey Davila MRN: [id number] DOB: 1925-06-04    ADMISSION DATE:  04-24-13 CONSULTATION DATE:  24-Apr-2013 LOS 12 days  REFERRING MD :  Romie Levee, MD PRIMARY SERVICE: Surgery  CHIEF COMPLAINT:   Post repair of incarcerated femoral hernia. Out of the OR intubated.   BRIEF PATIENT DESCRIPTION:  79 years old male with PMH relevant for HTN.  Underwent emergent repair of incarcerated hernia and now out of the OR intubated in the ICU. CCM consult called to assist with ventilator management. . Admitted 2013/04/24 10:12 AM  LINES / TUBES: 12/14 L IJ CVL >> 12/13 ETT >>  CULTURES: 12/13 - Urine - nega 12/14 resp: pan sens Klebsiella PNA  12/14 blood >> Klebsiella ................................ 12/18 BAL>>> KLEBSIELLA (2 strains, most resistant still sensit to cefepime) 04/20/13 - BAL Resp Virus PCR - NEGATIVE  ANTIBIOTICS: 12/13 vanc >>12/15 12/13 zosyn >>12/18 .................... 12/18 primaxin>>>12/21 12/18 vanc>>> .......... 04/23/13 - cefepime>>>  SIGNIFICANT EVENTS / STUDIES:  12/14 CT angio chest/ab/pelv> esophagus full of fluid? (thick), nearly occludes left mainstem with external compression; left lower lung pulm nodule; aspiration pneumonitis bilateral base, SBO, R femoral hernia 12/14 afib with rvr/shock, attempted cardioversion x3 unsuccessful 12/18 FOB, copious thick secretions 04/22/13 - 70% fio2/10 peep. Agitated on WUA.  Not making progress. Stil febrile 12/22 a-fib with RVR, amiodarone started. 12/23 Lasix drip with great response. 04/26/13 - spouse wanting space as she prepares for end of life, as expressed to chaplain  SUBJECTIVE/OVERNIGHT/INTERVAL HX  04/27/13: tolerated PSV off sedation 10/5 for 2.5h and did follow few commands but then failed due to dysnchrony and air stacking. Wife at bedside  VITAL SIGNS: Temp:  [98 F (36.7 C)-99 F (37.2 C)] 98.3 F (36.8 C) (12/25 0800) Pulse Rate:  [71-104] 80 (12/25 1234) Resp:  [16-26] 18 (12/25  1234) BP: (83-144)/(34-56) 132/49 mmHg (12/25 1234) SpO2:  [87 %-100 %] 100 % (12/25 1234) FiO2 (%):  [40 %] 40 % (12/25 1234) Weight:  [76.2 kg (167 lb 15.9 oz)] 76.2 kg (167 lb 15.9 oz) (12/25 0533)  HEMODYNAMICS: CVP:  [6 mmHg] 6 mmHg  VENTILATOR SETTINGS: Vent Mode:  [-] PRVC FiO2 (%):  [40 %] 40 % Set Rate:  [18 bmp] 18 bmp Vt Set:  [450 mL] 450 mL PEEP:  [5 cmH20] 5 cmH20 Pressure Support:  [10 cmH20] 10 cmH20 Plateau Pressure:  [15 cmH20-20 cmH20] 17 cmH20  INTAKE / OUTPUT: Intake/Output     12/24 0701 - 12/25 0700 12/25 0701 - 12/26 0700   I.V. (mL/kg) 1725.7 (22.6) 150.5 (2)   NG/GT 1895 470   IV Piggyback 350    Total Intake(mL/kg) 3970.7 (52.1) 620.5 (8.1)   Urine (mL/kg/hr) 5170 (2.8) 750 (1.6)   Total Output 5170 750   Net -1199.3 -129.5        Stool Occurrence 1 x     PHYSICAL EXAMINATION: General: Comfortable on vent, weaning. HEENT: NCAT, ETT in place PULM: Decreased t/o CV: RRR, no mgr, NSR on tele  AB: BS noted, non-tender Ext: warm, no edema, SCD's Neuro: arouses easily, follows commands  LABS: PULMONARY  Recent Labs Lab 04/23/13 0412 04/24/13 1250 04/25/13 0440 04/26/13 0402 04/27/13 0359  PHART 7.277* 7.326* 7.341* 7.356 7.335*  PCO2ART 70.8* 60.1* 59.9* 66.0* 74.3*  PO2ART 88.4 64.8* 66.9* 71.1* 94.2  HCO3 31.3* 30.5* 31.3* 35.8* 38.6*  TCO2 29.9 29.4 29.9 33.9 36.9  O2SAT 95.2 91.3 90.6 91.9 96.0   CBC  Recent Labs Lab 04/25/13 0500 04/26/13 0343 04/27/13 0405  HGB 8.3* 8.7* 8.4*  HCT 25.8* 26.4* 26.4*  WBC 13.9* 12.8* 13.7*  PLT 179 237 294   COAGULATION  Recent Labs Lab 04/24/13 1255  INR 1.21    CARDIAC  No results found for this basename: TROPONINI,  in the last 168 hours  Recent Labs Lab 04/23/13 0500  PROBNP 821.7*   CHEMISTRY  Recent Labs Lab 04/23/13 0500 04/24/13 0400 04/25/13 0500 04/26/13 0343 04/26/13 1400 04/27/13 0405  NA 154* 150* 148* 147* 149* 147*  K 4.5 4.6 4.1 3.0* 4.1 3.2*  CL  118* 112 110 104 103 101  CO2 32 30 31 36* 38* 38*  GLUCOSE 132* 119* 142* 152* 158* 138*  BUN 58* 83* 94* 91* 88* 90*  CREATININE 1.37* 2.24* 2.28* 2.15* 2.09* 2.10*  CALCIUM 9.0 8.7 8.5 8.5 8.5 8.6  MG 2.2 2.4 2.3 2.1  --  2.0  PHOS 3.4 5.1* 4.5 4.9*  --  4.9*   Estimated Creatinine Clearance: 27.4 ml/min (by C-G formula based on Cr of 2.1).  LIVER  Recent Labs Lab 04/21/13 0500 04/22/13 0330 04/24/13 1255  AST 24 34  --   ALT 14 18  --   ALKPHOS 111 115  --   BILITOT 3.0* 2.9*  --   PROT 5.1* 4.9*  --   ALBUMIN 1.6* 1.4*  --   INR  --   --  1.21   INFECTIOUS  Recent Labs Lab 04/21/13 0500 04/22/13 0330 04/24/13 0545  LATICACIDVEN  --   --  1.0  PROCALCITON 2.75 1.90  --    ENDOCRINE CBG (last 3)   Recent Labs  04/27/13 0337 04/27/13 0748 04/27/13 1207  GLUCAP 140* 159* 141*   IMAGING x48h  Dg Chest Port 1 View  04/27/2013   CLINICAL DATA:  Evaluate endotracheal tube position.  EXAM: PORTABLE CHEST - 1 VIEW  COMPARISON:  04/26/2013  IMPRESSION: Support apparatus is stable and well positioned.  Bilateral lung opacities are unchanged from the previous day's study.  FINDINGS:A: FINDINGS:A Endotracheal tube tip lies 3 cm above the carina. Left internal jugular central venous line tip lies in the mid superior vena cava. Enteric tube passes below the diaphragm.  Patchy bilateral airspace lung opacities with associated coarse interstitial densities are stable suggesting multifocal pneumonia.  No pneumothorax.   Electronically Signed   By: Amie Portland M.D.   On: 04/27/2013 07:41   Dg Chest Port 1 View  04/26/2013   CLINICAL DATA:  Intubation.  EXAM: PORTABLE CHEST - 1 VIEW  COMPARISON:  04/25/2013 .  FINDINGS: Endotracheal tube, left IJ line, NG tube in stable position. Dense pulmonary infiltrates bilaterally remain. Pulmonary infiltrates improved slightly from prior exam. Heart size and pulmonary vascularity stable. Bilateral pleural effusions have almost completely  cleared. No pneumothorax. No acute osseous abnormality.  IMPRESSION: 1. Stable line and tube positions. 2. Partial clearing of dense bilateral pulmonary infiltrates. Near complete clearing of bilateral pleural effusions.   Electronically Signed   By: Maisie Fus  Register   On: 04/26/2013 07:37   ASSESSMENT / PLAN:  PULMONARY A: 1) Acute hypoxemic respiratory failure due to aspiration pneumonitis/ ARDS    04/27/13: Faled SBT after 2h, suggests chronic critical illnes weakness  P:   - Continue PSV  As tolerated but no extubation given WOB. - See ID sectional. - CXR and ABG in AM. - Diureses as ordered.  CARDIOVASCULAR A:  1) Septic shock > resolved 2) New onset afib with RVR due to sepsis and levophed, failed  cardioversion; converted with amiodarone, Now NSR w/ occ PAC, had Run of SVT 12/19 04/23/13 - sinus tach 12/25 -on amio. Intermittent a flutter P:  - PRN lopressor. - KVO IVFs. - Heparin drip per pharmacy and will need coumadin as outpatient. - Amiodarone drip as ordered.  RENAL A:   Acute renal failure> resolved Hypernatremia s/p diuresis , and worsening Also 3rd spacing  04/27/13: Off lasix at 10am P:   - Free water. - F/U chem. -- Zaroxolyn as ordered. - Diamox x2 doses as ordered. - Replace electrolytes as needed.   GASTROINTESTINAL A:   1) R femoral hernia and SBO, viable bowel; Status post surgical repair  12/25 - tolerating tube feeds with residuals in 200cc range P:   - GI prophylaxis with protonix. - Advance TF to goal.  HEMATOLOGIC A:   ANemia of critical illness Mild thrombocytopenia (dropped 50%) due to illness: Plts holding in the 70-80 range. Changed to LMWH on 12/16.  HTT negative 12/16//14  P:  - PRBC for hgb </= 6.9gm%    - exceptions are   -  if ACS susepcted/confirmed then transfuse for hgb </= 8.0gm%,  or    - active bleeding with hemodynamic instability, then transfuse regardless of hemoglobin value   At at all times try to transfuse  1 unit prbc as possible with exception of active hemorrhage  INFECTIOUS A:   1) Aspiration PNA + Klebsiella PNA (Pan sens)   04/27/13 - on cefepmime. Afebrile  P:   - Changed primaxin to cefepime. - Continue vanc. - See above, f/u culture data narrow when able  - Hold off starting anti-fungal for now.  ENDOCRINE A:   1) No issues    NEUROLOGIC A:   1) Intubated, sedated  04/27/13 - Agiated on WUA but did follow some commands  P:   - Sedation and pain management with fentanyl - RASS score goal of -1  Global:  12/24: Tolerated diureses well, renal function holding, will continue diureses.  Spoke with wife today, once extubated is no longer to be intubated, no trach/peg/dialysis.  If renal function deteriorates and stops responding to diuretics then comfort care.  04/27/13: d/w wife at bedside.  Using best interest and substituted judgement (patient had experience his brother become vent dependent), no trach, no peg, no HD, no LTAC. NCB but continue medical care. She has expressed to chaplain that she is getting ready for his end of life and to me she is at peace if he dies. She is trying to time decision point; explaind that we are in the period now to next few days to entertain terminal wean  The patient is critically ill with multiple organ systems failure and requires high complexity decision making for assessment and support, frequent evaluation and titration of therapies, application of advanced monitoring technologies and extensive interpretation of multiple databases.   Critical Care Time devoted to patient care services described in this note is 35 minutes.   Dr. Kalman Shan, M.D., [facility].C.P Pulmonary and Critical Care Medicine Staff Physician [facility] [place] [facility] Pager: [phone], If no answer or between  15:00h - 7:00h: call [phone]  04/27/2013 1:36 PM

## 2013-04-28 ENCOUNTER — Inpatient Hospital Stay (HOSPITAL_COMMUNITY): Payer: Medicare Other

## 2013-04-28 LAB — CBC WITH DIFFERENTIAL/PLATELET
Basophils Relative: 0 % (ref 0–1)
Eosinophils Absolute: 0 10*3/uL (ref 0.0–0.7)
Eosinophils Relative: 0 % (ref 0–5)
Hemoglobin: 7.8 g/dL — ABNORMAL LOW (ref 13.0–17.0)
Lymphs Abs: 0.8 10*3/uL (ref 0.7–4.0)
MCH: 27.2 pg (ref 26.0–34.0)
MCHC: 32.6 g/dL (ref 30.0–36.0)
Monocytes Absolute: 0.6 10*3/uL (ref 0.1–1.0)
Neutro Abs: 9.9 10*3/uL — ABNORMAL HIGH (ref 1.7–7.7)
Neutrophils Relative %: 88 % — ABNORMAL HIGH (ref 43–77)
RBC: 2.87 MIL/uL — ABNORMAL LOW (ref 4.22–5.81)
RDW: 16.5 % — ABNORMAL HIGH (ref 11.5–15.5)

## 2013-04-28 LAB — GLUCOSE, CAPILLARY
Glucose-Capillary: 134 mg/dL — ABNORMAL HIGH (ref 70–99)
Glucose-Capillary: 133 mg/dL — ABNORMAL HIGH (ref 70–99)
Glucose-Capillary: 119 mg/dL — ABNORMAL HIGH (ref 70–99)

## 2013-04-28 LAB — BASIC METABOLIC PANEL
BUN: 89 mg/dL — ABNORMAL HIGH (ref 6–23)
Calcium: 8.4 mg/dL (ref 8.4–10.5)
GFR calc Af Amer: 37 mL/min — ABNORMAL LOW (ref >90)
GFR calc non Af Amer: 32 mL/min — ABNORMAL LOW (ref >90)
Glucose, Bld: 147 mg/dL — ABNORMAL HIGH (ref 70–99)
Sodium: 146 mEq/L — ABNORMAL HIGH (ref 135–145)

## 2013-04-28 LAB — MAGNESIUM: Magnesium: 2.1 mg/dL (ref 1.5–2.5)

## 2013-04-28 MED ORDER — SODIUM CHLORIDE 0.9 % IV SOLN: 1.0000 mg/h | INTRAVENOUS | Status: DC

## 2013-04-28 MED ORDER — MORPHINE SULFATE 10 MG/ML IJ SOLN
10.0000 mg/h | INTRAVENOUS | Status: DC
  Administered 2013-04-28: 1 mg/h via INTRAVENOUS
  Administered 2013-04-28: 2 mg/h via INTRAVENOUS
  Administered 2013-04-29: 4 mg/h via INTRAVENOUS
  Administered 2013-04-29 – 2013-04-30 (×2): 10 mg/h via INTRAVENOUS
  Filled 2013-04-28 (×5): qty 10

## 2013-04-28 MED ORDER — FUROSEMIDE 10 MG/ML IJ SOLN
40.0000 mg | Freq: Four times a day (QID) | INTRAMUSCULAR | Status: DC
  Administered 2013-04-28: 40 mg via INTRAVENOUS
  Filled 2013-04-28: qty 4

## 2013-04-28 MED ORDER — POTASSIUM CHLORIDE 10 MEQ/50ML IV SOLN
10.0000 meq | INTRAVENOUS | Status: DC
  Administered 2013-04-28 (×3): 10 meq via INTRAVENOUS
  Filled 2013-04-28 (×4): qty 50

## 2013-04-28 MED ORDER — MORPHINE BOLUS VIA INFUSION
5.0000 mg | INTRAVENOUS | Status: DC | PRN
  Filled 2013-04-28: qty 20

## 2013-04-28 NOTE — Progress Notes (Addendum)
ANTICOAGULATION CONSULT NOTE - Follow Up Consult  Pharmacy Consult for heparin Indication: atrial fibrillation  Allergies  Allergen Reactions  . Vicodin [Hydrocodone-Acetaminophen] Nausea And Vomiting    Patient Measurements: Height: 5\' 11"  (180.3 cm) Weight: 163 lb 12.8 oz (74.3 kg) IBW/kg (Calculated) : 75.3 Heparin Dosing Weight:   Vital Signs: Temp: 99.6 F (37.6 C) (12/26 0400) Temp src: Oral (12/26 0400) BP: 149/52 mmHg (12/26 0600) Pulse Rate: 82 (12/26 0600)  Labs:  Recent Labs  04/26/13 0343  04/26/13 1400  04/27/13 0405 04/27/13 1542 04/28/13 0430  HGB 8.7*  --   --   --  8.4*  --  7.8*  HCT 26.4*  --   --   --  26.4*  --  23.9*  PLT 237  --   --   --  294  --  350  HEPARINUNFRC 0.19*  < >  --   < > 0.15* 0.29* 0.34  CREATININE 2.15*  --  2.09*  --  2.10*  --  1.83*  < > = values in this interval not displayed.  Estimated Creatinine Clearance: 31 ml/min (by C-G formula based on Cr of 1.83).   Medications:  Infusions:  . sodium chloride 10 mL/hr (04/26/13 1900)  . amiodarone (NEXTERONE PREMIX) 360 mg/200 mL dextrose 30 mg/hr (04/28/13 0515)  . fentaNYL infusion INTRAVENOUS 250 mcg/hr (04/28/13 0312)  . heparin 1,950 Units/hr (04/28/13 0149)    Assessment: Patient with heparin level at goal.  No issues per RN.    Goal of Therapy:  Heparin level 0.3-0.7 units/ml Monitor platelets by anticoagulation protocol: Yes   Plan:  Heparin level at goal.   Continue heparin at current rate, recheck level at [address], [place] 04/28/2013,6:09 AM  Addendum:   Given initiation of morphine gtt (confirmed with RN) and the last 2 heparin levels were 0.29 and 0.34. I will go ahead and discontinue the HL at 1300 today and f/u in AM if still continued.  Geoffry Paradise, PharmD, BCPS Pager: [phone] 11:02 AM Pharmacy #: [id number]

## 2013-04-28 NOTE — Progress Notes (Signed)
Post extubation fent. Drip dc'd 200mg  wasted in sink with Oneita Hurt RN

## 2013-04-28 NOTE — Progress Notes (Signed)
13 Days Post-Op   Assessment: s/p Procedure(s): HERNIA REPAIR INGUINAL ADULT Patient Active Problem List   Diagnosis Date Noted  . Hypernatremia 04/23/2013  . Thrombocytopathia 04/19/2013  . A-fib 04/19/2013  . Protein-calorie malnutrition, severe 04/19/2013  . ARDS (adult respiratory distress syndrome) 04/19/2013  . Acute respiratory failure 04/16/2013  . Sepsis(995.91) 04/16/2013  . AKI (acute kidney injury) 04/16/2013  . Hypokalemia 04/16/2013  . Incarcerated femoral hernia s/p open repair 05-05-2013 05/05/13  . Femoral hernia, right 04/04/2013  . Hydrocele, right 04/04/2013  . DIVERTICULOSIS-COLON 01/05/2008  . WEIGHT LOSS-ABNORMAL 01/05/2008  . PERSONAL HX COLONIC POLYPS 01/05/2008    Stable surgically: persistent mild hypokalemia  Plan: Management per primary care team, no surgical issues noted  Subjective: No apparent c/o  Objective: Vital signs in last 24 hours: Temp:  [98.1 F (36.7 C)-99.6 F (37.6 C)] 99.6 F (37.6 C) (12/26 0400) Pulse Rate:  [65-126] 82 (12/26 0600) Resp:  [16-28] 23 (12/26 0600) BP: (91-149)/(3-81) 149/52 mmHg (12/26 0600) SpO2:  [95 %-100 %] 99 % (12/26 0600) FiO2 (%):  [40 %] 40 % (12/26 0600) Weight:  [163 lb 12.8 oz (74.3 kg)] 163 lb 12.8 oz (74.3 kg) (12/26 0400)   Intake/Output from previous day: 12/25 0701 - 12/26 0700 In: 3644.3 [I.V.:1802.8; WU/JW:1191.4; IV Piggyback:150] Out: 2665 [Urine:2665]  General appearance: no distress, slowed mentation and only minimally responsive to verbal stimuli GI: soft, non-tender; bowel sounds normal; no masses,  no organomegaly  Incision: healing well  Lab Results:   Recent Labs  04/27/13 0405 04/28/13 0430  WBC 13.7* 11.3*  HGB 8.4* 7.8*  HCT 26.4* 23.9*  PLT 294 350   BMET  Recent Labs  04/27/13 0405 04/28/13 0430  NA 147* 146*  K 3.2* 3.2*  CL 101 102  CO2 38* 39*  GLUCOSE 138* 147*  BUN 90* 89*  CREATININE 2.10* 1.83*  CALCIUM 8.6 8.4    MEDS,  Scheduled . antiseptic oral rinse  1 application Mouth Rinse QID  . bisacodyl  10 mg Rectal Daily  . ceFEPime (MAXIPIME) IV  1 g Intravenous Q24H  . chlorhexidine  15 mL Mouth/Throat BID  . feeding supplement (OXEPA)  1,000 mL Per Tube Q24H  . feeding supplement (PRO-STAT SUGAR FREE 64)  30 mL Per Tube Q24H  . free water  250 mL Per Tube Q6H  . pantoprazole sodium  40 mg Per Tube Daily  . vancomycin  500 mg Intravenous Q24H    Studies/Results: Dg Chest Port 1 View  04/27/2013   CLINICAL DATA:  Evaluate endotracheal tube position.  EXAM: PORTABLE CHEST - 1 VIEW  COMPARISON:  04/26/2013  IMPRESSION: Support apparatus is stable and well positioned.  Bilateral lung opacities are unchanged from the previous day's study.  FINDINGS:A: FINDINGS:A Endotracheal tube tip lies 3 cm above the carina. Left internal jugular central venous line tip lies in the mid superior vena cava. Enteric tube passes below the diaphragm.  Patchy bilateral airspace lung opacities with associated coarse interstitial densities are stable suggesting multifocal pneumonia.  No pneumothorax.   Electronically Signed   By: Amie Portland M.D.   On: 04/27/2013 07:41      LOS: 13 days     Currie Paris, MD, [facility], [place] [phone]   04/28/2013 7:31 AM

## 2013-04-28 NOTE — Progress Notes (Signed)
Name: Casey Davila MRN: [id number] DOB: 1926-02-14    ADMISSION DATE:  04/12/2013 CONSULTATION DATE:  04/04/2013 LOS 13 days  REFERRING MD :  Romie Levee, MD PRIMARY SERVICE: Surgery  CHIEF COMPLAINT:   Post repair of incarcerated femoral hernia. Out of the OR intubated.   BRIEF PATIENT DESCRIPTION:  79 years old male with PMH relevant for HTN.  Underwent emergent repair of incarcerated hernia and now out of the OR intubated in the ICU. CCM consult called to assist with ventilator management. . Admitted 04/18/2013 10:12 AM  LINES / TUBES: 12/14 L IJ CVL >> 12/13 ETT >>12/26  CULTURES: 12/13 - Urine - nega 12/14 resp: pan sens Klebsiella PNA  12/14 blood >> Klebsiella ................................ 12/18 BAL>>> KLEBSIELLA (2 strains, most resistant still sensit to cefepime) 04/20/13 - BAL Resp Virus PCR - NEGATIVE  ANTIBIOTICS: 12/13 vanc >>12/15 12/13 zosyn >>12/18 .................... 12/18 primaxin>>>12/21 12/18 vanc>>> .......... 04/23/13 - cefepime>>>  SIGNIFICANT EVENTS / STUDIES:  12/14 CT angio chest/ab/pelv> esophagus full of fluid? (thick), nearly occludes left mainstem with external compression; left lower lung pulm nodule; aspiration pneumonitis bilateral base, SBO, R femoral hernia 12/14 afib with rvr/shock, attempted cardioversion x3 unsuccessful 12/18 FOB, copious thick secretions 04/22/13 - 70% fio2/10 peep. Agitated on WUA.  Not making progress. Stil febrile 12/22 a-fib with RVR, amiodarone started. 12/23 Lasix drip with great response. 04/26/13 - spouse wanting space as she prepares for end of life, as expressed to chaplain  SUBJECTIVE/OVERNIGHT/INTERVAL HX Awake and follows command but respiratory effort remains a concern.  VITAL SIGNS: Temp:  [98.1 F (36.7 C)-99.8 F (37.7 C)] 99.8 F (37.7 C) (12/26 0800) Pulse Rate:  [65-126] 83 (12/26 0800) Resp:  [16-28] 20 (12/26 0800) BP: (91-149)/(3-81) 149/58 mmHg (12/26 0800) SpO2:  [95 %-100  %] 100 % (12/26 0800) FiO2 (%):  [40 %] 40 % (12/26 0600) Weight:  [74.3 kg (163 lb 12.8 oz)] 74.3 kg (163 lb 12.8 oz) (12/26 0400)  HEMODYNAMICS:    VENTILATOR SETTINGS: Vent Mode:  [-] PRVC FiO2 (%):  [40 %] 40 % Set Rate:  [18 bmp] 18 bmp Vt Set:  [450 mL] 450 mL PEEP:  [5 cmH20] 5 cmH20 Pressure Support:  [10 cmH20] 10 cmH20 Plateau Pressure:  [13 cmH20-22 cmH20] 19 cmH20  INTAKE / OUTPUT: Intake/Output     12/25 0701 - 12/26 0700 12/26 0701 - 12/27 0700   I.V. (mL/kg) 1802.8 (24.3) 53.4 (0.7)   NG/GT 1691.5 90   IV Piggyback 150    Total Intake(mL/kg) 3644.3 (49) 143.4 (1.9)   Urine (mL/kg/hr) 2665 (1.5)    Total Output 2665     Net +979.3 +143.4        Stool Occurrence 1 x     PHYSICAL EXAMINATION: General: Comfortable on vent, weaning. HEENT: NCAT, ETT in place. PULM: Diffuse crackles with coarse BS. CV: RRR, no mgr, NSR on tele. AB: BS noted, non-tender. Ext: warm, no edema, SCD's. Neuro: Arouses easily, follows commands.  LABS: PULMONARY  Recent Labs Lab 04/23/13 0412 04/24/13 1250 04/25/13 0440 04/26/13 0402 04/27/13 0359  PHART 7.277* 7.326* 7.341* 7.356 7.335*  PCO2ART 70.8* 60.1* 59.9* 66.0* 74.3*  PO2ART 88.4 64.8* 66.9* 71.1* 94.2  HCO3 31.3* 30.5* 31.3* 35.8* 38.6*  TCO2 29.9 29.4 29.9 33.9 36.9  O2SAT 95.2 91.3 90.6 91.9 96.0   CBC  Recent Labs Lab 04/26/13 0343 04/27/13 0405 04/28/13 0430  HGB 8.7* 8.4* 7.8*  HCT 26.4* 26.4* 23.9*  WBC 12.8* 13.7* 11.3*  PLT 237 294  350   COAGULATION  Recent Labs Lab 04/24/13 1255  INR 1.21    CARDIAC  No results found for this basename: TROPONINI,  in the last 168 hours  Recent Labs Lab 04/23/13 0500  PROBNP 821.7*   CHEMISTRY  Recent Labs Lab 04/24/13 0400 04/25/13 0500 04/26/13 0343 04/26/13 1400 04/27/13 0405 04/28/13 0430  NA 150* 148* 147* 149* 147* 146*  K 4.6 4.1 3.0* 4.1 3.2* 3.2*  CL 112 110 104 103 101 102  CO2 30 31 36* 38* 38* 39*  GLUCOSE 119* 142* 152*  158* 138* 147*  BUN 83* 94* 91* 88* 90* 89*  CREATININE 2.24* 2.28* 2.15* 2.09* 2.10* 1.83*  CALCIUM 8.7 8.5 8.5 8.5 8.6 8.4  MG 2.4 2.3 2.1  --  2.0 2.1  PHOS 5.1* 4.5 4.9*  --  4.9* 3.3   Estimated Creatinine Clearance: 31 ml/min (by C-G formula based on Cr of 1.83).  LIVER  Recent Labs Lab 04/22/13 0330 04/24/13 1255  AST 34  --   ALT 18  --   ALKPHOS 115  --   BILITOT 2.9*  --   PROT 4.9*  --   ALBUMIN 1.4*  --   INR  --  1.21   INFECTIOUS  Recent Labs Lab 04/22/13 0330 04/24/13 0545  LATICACIDVEN  --  1.0  PROCALCITON 1.90  --    ENDOCRINE CBG (last 3)   Recent Labs  04/28/13 0105 04/28/13 0338 04/28/13 0730  GLUCAP 134* 130* 133*   IMAGING x48h  Dg Chest Port 1 View  04/28/2013   CLINICAL DATA:  Endotracheal tube.  EXAM: PORTABLE CHEST - 1 VIEW  COMPARISON:  04/27/2013  FINDINGS: Tip of an endotracheal tube projects approximately 6 cm above the carina. Left jugular central venous catheter tip overlies the mid SVC, unchanged. Enteric tube courses into the upper abdomen with tip not imaged. Cardiomediastinal silhouette is within normal limits. Patchy opacities remain throughout both lungs, with interval worsening in the right lower lung. No definite pleural effusion or pneumothorax is identified.  IMPRESSION: Bilateral lung opacities concerning for multifocal pneumonia. Interval worsening in the right lower lung.   Electronically Signed   By: Sebastian Ache   On: 04/28/2013 07:45   Dg Chest Port 1 View  04/27/2013   CLINICAL DATA:  Evaluate endotracheal tube position.  EXAM: PORTABLE CHEST - 1 VIEW  COMPARISON:  04/26/2013  IMPRESSION: Support apparatus is stable and well positioned.  Bilateral lung opacities are unchanged from the previous day's study.  FINDINGS:A: FINDINGS:A Endotracheal tube tip lies 3 cm above the carina. Left internal jugular central venous line tip lies in the mid superior vena cava. Enteric tube passes below the diaphragm.  Patchy bilateral  airspace lung opacities with associated coarse interstitial densities are stable suggesting multifocal pneumonia.  No pneumothorax.   Electronically Signed   By: Amie Portland M.D.   On: 04/27/2013 07:41   ASSESSMENT / PLAN:  PULMONARY A: 1) Acute hypoxemic respiratory failure due to aspiration pneumonitis/ ARDS  P:   - One way extubation today, if fails then will start morphine for comfort. - See ID sectional. - Diureses as ordered.  CARDIOVASCULAR A:  1) Septic shock > resolved 2) New onset afib with RVR due to sepsis and levophed, failed cardioversion; converted with amiodarone, Now NSR w/ occ PAC, had P:  - PRN lopressor. - KVO IVFs. - Heparin drip per pharmacy and will need coumadin as outpatient. - Amiodarone drip as ordered.  RENAL A:  Acute renal failure> resolved Hypernatremia s/p diuresis , and worsening Also 3rd spacing  04/27/13: Off lasix at 10am P:   - Free water will be stopped post extubation. - F/U chem. - Replace electrolytes as needed. - Lasix 40 mg IV q6 x3 doses. - KCl as ordered.  GASTROINTESTINAL A:   1) R femoral hernia and SBO, viable bowel; Status post surgical repair  12/25 - tolerating tube feeds with residuals in 200cc range P:   - GI prophylaxis with protonix. - Advance TF to goal.  HEMATOLOGIC A:   ANemia of critical illness Mild thrombocytopenia (dropped 50%) due to illness: Plts holding in the 70-80 range. Changed to LMWH on 12/16.  HTT negative 12/16//14  P:  - PRBC for hgb </= 6.9gm%    - exceptions are   -  if ACS susepcted/confirmed then transfuse for hgb </= 8.0gm%,  or    - active bleeding with hemodynamic instability, then transfuse regardless of hemoglobin value   At at all times try to transfuse 1 unit prbc as possible with exception of active hemorrhage  INFECTIOUS A:   1) Aspiration PNA + Klebsiella PNA (Pan sens)   P:   - Changed primaxin to cefepime. - Continue vanc. - Hold off starting anti-fungal for now  given dropping WBC and cessation of fever.  ENDOCRINE A:   1) No issues    NEUROLOGIC A:   1) Intubated, sedated  04/27/13 - Agiated on WUA but did follow some commands  P:   - Sedation and pain management with fentanyl - RASS score goal of -1  Global:  Patient is unlikely to do well, spoke with wife extensively.  After discussion, decision was made that once extubated, if fails then will change to comfort care and maintain DNR status.  The patient is critically ill with multiple organ systems failure and requires high complexity decision making for assessment and support, frequent evaluation and titration of therapies, application of advanced monitoring technologies and extensive interpretation of multiple databases.   Critical Care Time devoted to patient care services described in this note is 35 minutes.  Alyson Reedy, M.D. [facility] Pulmonary/Critical Care Medicine. Pager: [phone]. After hours pager: [phone].

## 2013-04-28 NOTE — Procedures (Signed)
Extubation Procedure Note  Patient Details:   Name: [name] DOB: 1926-03-21 MRN: [id number]   Airway Documentation:     Evaluation  O2 sats: stable throughout Complications: No apparent complications Patient did tolerate procedure well. Bilateral Breath Sounds: Diminished Suctioning: Airway No  Suzan Garibaldi 04/28/2013, 10:34 AM

## 2013-04-28 NOTE — Progress Notes (Signed)
[id number] Davis,RN,BSN,CCM: Patient extubated this am.  Will Follow for needs/family is by the bedside and is aware of possible downward progress.

## 2013-04-28 NOTE — Progress Notes (Signed)
Patient extubated but breathing rapidly and desaturating.  Wife feels that patient is doing ok for now but would like the option of morphine if patient decompensates.  Will leave morphine order on the chart and when wife and RN feels patient is decompensating then may start morphine.  Alyson Reedy, M.D. [facility] Pulmonary/Critical Care Medicine. Pager: [phone]. After hours pager: [phone].

## 2013-04-29 DIAGNOSIS — A419 Sepsis, unspecified organism: Secondary | ICD-10-CM

## 2013-04-29 DIAGNOSIS — E43 Unspecified severe protein-calorie malnutrition: Secondary | ICD-10-CM

## 2013-04-29 DIAGNOSIS — Z515 Encounter for palliative care: Secondary | ICD-10-CM

## 2013-04-29 NOTE — Progress Notes (Signed)
Name: KIMARION CHERY MRN: [id number] DOB: [birth year]    ADMISSION DATE:  04/25/2013 CONSULTATION DATE:  04/18/2013 LOS 14 days  REFERRING MD :  Romie Levee, MD PRIMARY SERVICE: Surgery  CHIEF COMPLAINT:   Post repair of incarcerated femoral hernia. Out of the OR intubated.   BRIEF PATIENT DESCRIPTION:  79 years old male with PMH relevant for HTN.  Underwent emergent repair of incarcerated hernia and now out of the OR intubated in the ICU. CCM consult called to assist with ventilator management. . Admitted 04/12/2013 10:12 AM  LINES / TUBES: 12/14 L IJ CVL >> 12/13 ETT >>12/26  CULTURES: 12/13 - Urine - nega 12/14 resp: pan sens Klebsiella PNA  12/14 blood >> Klebsiella ................................ 12/18 BAL>>> KLEBSIELLA (2 strains, most resistant still sensit to cefepime) 04/20/13 - BAL Resp Virus PCR - NEGATIVE  ANTIBIOTICS: 12/13 vanc >>12/15 12/13 zosyn >>12/18 .................... 12/18 primaxin>>>12/21 12/18 vanc>>> .......... 04/23/13 - cefepime>>>  SIGNIFICANT EVENTS / STUDIES:  12/14 CT angio chest/ab/pelv> esophagus full of fluid? (thick), nearly occludes left mainstem with external compression; left lower lung pulm nodule; aspiration pneumonitis bilateral base, SBO, R femoral hernia 12/14 afib with rvr/shock, attempted cardioversion x3 unsuccessful 12/18 FOB, copious thick secretions 04/22/13 - 70% fio2/10 peep. Agitated on WUA.  Not making progress. Stil febrile 12/22 a-fib with RVR, amiodarone started. 12/23 Lasix drip with great response. 04/26/13 - spouse wanting space as she prepares for end of life, as expressed to chaplain  SUBJECTIVE/OVERNIGHT/INTERVAL HX Less responsive. On morphine drip. Full comfort care .  VITAL SIGNS: Temp:  [97.5 F (36.4 C)-98.9 F (37.2 C)] 97.9 F (36.6 C) (12/27 0400) Pulse Rate:  [83-92] 89 (12/27 0400) Resp:  [23-41] 28 (12/27 0400) BP: (151-163)/(53-76) 155/74 mmHg (12/27 0400) SpO2:  [87 %-100 %] 100 %  (12/27 0400)   INTAKE / OUTPUT: Intake/Output     12/26 0701 - 12/27 0700 12/27 0701 - 12/28 0700   I.V. (mL/kg) 509.6 (6.9) 8 (0.1)   Other 50 20   NG/GT 170.3    IV Piggyback 150    Total Intake(mL/kg) 879.8 (11.8) 28 (0.4)   Urine (mL/kg/hr) 2950 (1.7)    Total Output 2950     Net -2070.2 +28         PHYSICAL EXAMINATION: General: Comfortable with mild increase WOB HEENT: NCAT,  PULM: Diffuse crackles with coarse BS. CV: RRR, no mgr, NSR on tele. AB: BS noted, non-tender. Ext: warm, no edema, SCD's. Neuro: Arouses easily, will not f/c on morphine drip.  LABS: PULMONARY  Recent Labs Lab 04/23/13 0412 04/24/13 1250 04/25/13 0440 04/26/13 0402 04/27/13 0359  PHART 7.277* 7.326* 7.341* 7.356 7.335*  PCO2ART 70.8* 60.1* 59.9* 66.0* 74.3*  PO2ART 88.4 64.8* 66.9* 71.1* 94.2  HCO3 31.3* 30.5* 31.3* 35.8* 38.6*  TCO2 29.9 29.4 29.9 33.9 36.9  O2SAT 95.2 91.3 90.6 91.9 96.0   CBC  Recent Labs Lab 04/26/13 0343 04/27/13 0405 04/28/13 0430  HGB 8.7* 8.4* 7.8*  HCT 26.4* 26.4* 23.9*  WBC 12.8* 13.7* 11.3*  PLT 237 294 350   COAGULATION  Recent Labs Lab 04/24/13 1255  INR 1.21    CARDIAC  No results found for this basename: TROPONINI,  in the last 168 hours  Recent Labs Lab 04/23/13 0500  PROBNP 821.7*   CHEMISTRY  Recent Labs Lab 04/24/13 0400 04/25/13 0500 04/26/13 0343 04/26/13 1400 04/27/13 0405 04/28/13 0430  NA 150* 148* 147* 149* 147* 146*  K 4.6 4.1 3.0* 4.1 3.2* 3.2*  CL 112  110 104 103 101 102  CO2 30 31 36* 38* 38* 39*  GLUCOSE 119* 142* 152* 158* 138* 147*  BUN 83* 94* 91* 88* 90* 89*  CREATININE 2.24* 2.28* 2.15* 2.09* 2.10* 1.83*  CALCIUM 8.7 8.5 8.5 8.5 8.6 8.4  MG 2.4 2.3 2.1  --  2.0 2.1  PHOS 5.1* 4.5 4.9*  --  4.9* 3.3   Estimated Creatinine Clearance: 31 ml/min (by C-G formula based on Cr of 1.83).  LIVER  Recent Labs Lab 04/24/13 1255  INR 1.21   INFECTIOUS  Recent Labs Lab 04/24/13 0545  LATICACIDVEN  1.0   ENDOCRINE CBG (last 3)   Recent Labs  04/28/13 0338 04/28/13 0730 04/28/13 1139  GLUCAP 130* 133* 119*   IMAGING x48h  Dg Chest Port 1 View  04/28/2013   CLINICAL DATA:  Endotracheal tube.  EXAM: PORTABLE CHEST - 1 VIEW  COMPARISON:  04/27/2013  FINDINGS: Tip of an endotracheal tube projects approximately 6 cm above the carina. Left jugular central venous catheter tip overlies the mid SVC, unchanged. Enteric tube courses into the upper abdomen with tip not imaged. Cardiomediastinal silhouette is within normal limits. Patchy opacities remain throughout both lungs, with interval worsening in the right lower lung. No definite pleural effusion or pneumothorax is identified.  IMPRESSION: Bilateral lung opacities concerning for multifocal pneumonia. Interval worsening in the right lower lung.   Electronically Signed   By: Sebastian Ache   On: 04/28/2013 07:45   ASSESSMENT / PLAN:  PULMONARY A: 1) Acute hypoxemic respiratory failure due to aspiration pneumonitis/ ARDS  P:   - now full comfort care  CARDIOVASCULAR A:  1) Septic shock > resolved 2) New onset afib with RVR due to sepsis and levophed, failed cardioversion; converted with amiodarone, Now NSR w/ occ PAC, had   P:  - full comfort care now  RENAL A:   Acute renal failure> resolved Hypernatremia s/p diuresis , and worsening Also 3rd spacing   P:   - no more lab draws GASTROINTESTINAL A:   1) R femoral hernia and SBO, viable bowel; Status post surgical repair   P:   - TF off Comfort care only  HEMATOLOGIC A:   ANemia of critical illness Mild thrombocytopenia (dropped 50%) due to illness: Plts holding in the 70-80 range. Changed to LMWH on 12/16.  HTT negative 12/16//14  P:  -no more lab draws  INFECTIOUS A:   1) Aspiration PNA + Klebsiella PNA (Pan sens)   P:   -d/c ABX  -comfort care only   NEUROLOGIC A:  Actively dying, comfort care only  P:   Morphine drip  Global:  79 y.o.M now full  comfort care only Spouse updated at bedside tfr to floor   Dorcas Carrow Beeper  [phone]  Cell  [phone]  If no response or cell goes to voicemail, call beeper [phone]

## 2013-04-29 NOTE — Progress Notes (Signed)
Patient extubated. Patient on comfort care. Right groin staples are out. No general surgical issues. Will sign off.  Ovidio Kin, MD, [facility] Surgery Pager: [phone] Office phone:  [phone]

## 2013-04-30 DIAGNOSIS — R109 Unspecified abdominal pain: Secondary | ICD-10-CM

## 2013-05-04 NOTE — Progress Notes (Signed)
Assessed patient and announced time of death at 82. No heart beat or pulse was present.

## 2013-05-04 NOTE — Progress Notes (Signed)
Wasted rest of morphine drip- total 20 ml.

## 2013-05-04 NOTE — Discharge Summary (Signed)
  Death Summary  Casey Davila AVW:[id number] DOB: June 22, 1925 DOA: Apr 24, 2013  PCP: Minda Meo, MD PCP/Office notified  Admit date: April 24, 2013 Date of Death: 2013/05/09  Final Diagnoses:  Principal Problem:   Comfort measures only status Active Problems:   Incarcerated femoral hernia s/p open repair 2013/04/24   Acute respiratory failure   Sepsis(995.91)   AKI (acute kidney injury)   Hypokalemia   Thrombocytopathia   A-fib   Protein-calorie malnutrition, severe   ARDS (adult respiratory distress syndrome)   Hypernatremia  80 year old male with past medical history of hypertension, atrial fibrillation, this hospitalization for incarcerated hernia, underwent repair, required intubation and was taken care by PCCM for ventilator management. His wife decided on pursuing comfort care and he was started on morphine drip.   Assessment/Plan:   Principal Problem:  Comfort measures only status  - comfort care with morphine drip  - pt was not arousable to verbal or painful stimuli  Active Problems:  Incarcerated femoral hernia  - s/p open repair 2013-04-24  Acute respiratory failure with hypoxia  - extubated, was on morphine drip for comfort   Code Status: DNR/DNI    Consultants:  Initially under PCCM; on [facility] service from 05-09-13  Surgery  Procedures:  Incarcerated hernia repair  Cardioversion for atrial fibrillation x 3 times ANTIBIOTICS:  04/25/2023 vanc >>12/15  04/25/23 zosyn >>12/18 12/18 primaxin>>>12/21 04/23/13 - cefepime>>> 04/29/2013  Time: 16:05 09-May-2013  Signed:  Manson Passey  [facility] 2013-05-09, 6:00 PM

## 2013-05-04 NOTE — Progress Notes (Signed)
[facility] PROGRESS NOTE  Casey Davila ZOX:[id number] DOB: 07/08/1925 DOA: 04/27/2013 PCP: Minda Meo, MD  Brief narrative: 80 year old male with past medical history of hypertension, atrial fibrillation, this hospitalization for incarcerated hernia, underwent repair, required intubation and was taken care by PCCM for ventilator management. His wife decided on pursuing comfort care at this time and pt has been transferred to medical floor for further management   Assessment/Plan:  Principal Problem:   Comfort measures only status - comfort care with morphine drip - pt not arousable to verbal or painful stimuli Active Problems:   Incarcerated femoral hernia - s/p open repair 04/07/2013   Acute respiratory failure with hypoxia - extubated, on morphine drip for comfort   Code Status: DNR/DNI Family Communication: pt wife at the bedside  Disposition Plan: remains inpatient   Manson Passey, MD  [facility] Pager [phone]  If 7PM-7AM, please contact night-coverage [email or web address] Password [email or web address] 04/25/2013, 9:00 AM   LOS: 15 days   Consultants:  Initially under PCCM; on TR[facility] service from 04/25/2013  Surgery   Procedures:  Incarcerated hernia repair  Cardioversion for atrial fibrillation x 3 times  ANTIBIOTICS:   12/13 vanc >>12/15   12/13 zosyn >>12/18  ....................   12/18 primaxin>>>12/21  ..........   04/23/13 - cefepime>>> 04/29/2013  HPI/Subjective: No acute overnight events.   Objective: Filed Vitals:   04/29/13 0000 04/29/13 0400 04/29/13 1500 May 02, 2013 0554  BP: 160/61 155/74 146/66 120/53  Pulse: 89 89 100 94  Temp: 97.5 F (36.4 C) 97.9 F (36.6 C) 99.1 F (37.3 C) 101.2 F (38.4 C)  TempSrc: Oral Oral Axillary Axillary  Resp: 31 28 34 24  Height:   5\' 11"  (1.803 m)   Weight:   73.936 kg (163 lb)   SpO2: 100% 100% 87% 95%    Intake/Output Summary (Last 24 hours) at 04/29/2013 0900 Last data filed at 02-May-2013  8295  Gross per 24 hour  Intake     70 ml  Output   1800 ml  Net  -1730 ml    Exam:   General:  Pt is sleeping, not arousable to painful stimuli  Cardiovascular: Regular rate and rhythm, S1/S2 appreciated   Respiratory: diminished, bilateral air entry   Abdomen: Soft, non tender, non distended, bowel sounds present, no guarding  Extremities: No edema, pulses DP and PT palpable bilaterally  Neuro: Grossly nonfocal  Data Reviewed: Basic Metabolic Panel:  Recent Labs Lab 04/24/13 0400 04/25/13 0500 04/26/13 0343 04/26/13 1400 04/27/13 0405 04/28/13 0430  NA 150* 148* 147* 149* 147* 146*  K 4.6 4.1 3.0* 4.1 3.2* 3.2*  CL 112 110 104 103 101 102  CO2 30 31 36* 38* 38* 39*  GLUCOSE 119* 142* 152* 158* 138* 147*  BUN 83* 94* 91* 88* 90* 89*  CREATININE 2.24* 2.28* 2.15* 2.09* 2.10* 1.83*  CALCIUM 8.7 8.5 8.5 8.5 8.6 8.4  MG 2.4 2.3 2.1  --  2.0 2.1  PHOS 5.1* 4.5 4.9*  --  4.9* 3.3   Liver Function Tests: No results found for this basename: AST, ALT, ALKPHOS, BILITOT, PROT, ALBUMIN,  in the last 168 hours No results found for this basename: LIPASE, AMYLASE,  in the last 168 hours No results found for this basename: AMMONIA,  in the last 168 hours CBC:  Recent Labs Lab 04/24/13 0400 04/25/13 0500 04/26/13 0343 04/27/13 0405 04/28/13 0430  WBC 16.0* 13.9* 12.8* 13.7* 11.3*  NEUTROABS 13.8* 11.7* 11.0* 11.7* 9.9*  HGB 8.3* 8.3* 8.7*  8.4* 7.8*  HCT 26.6* 25.8* 26.4* 26.4* 23.9*  MCV 85.5 83.8 82.2 84.1 83.3  PLT 140* 179 237 294 350   Cardiac Enzymes: No results found for this basename: CKTOTAL, CKMB, CKMBINDEX, TROPONINI,  in the last 168 hours BNP: No components found with this basename: POCBNP,  CBG:  Recent Labs Lab 04/27/13 2015 04/28/13 0105 04/28/13 0338 04/28/13 0730 04/28/13 1139  GLUCAP 128* 134* 130* 133* 119*    CULTURE, BAL-QUANTITATIVE     Status: None   Collection Time    04/20/13  9:47 AM      Result Value Range Status    Specimen Description BRONCHIAL ALVEOLAR LAVAGE   Final     NO ORGANISMS SEEN   Value: KLEBSIELLA PNEUMONIAE     Performed at [facility]   Report Status 04/23/2013 FINAL   Final   Organism ID, Bacteria KLEBSIELLA PNEUMONIAE   Final   Respiratory Syncytial Virus A NOT DETECTED   Final   Respiratory Syncytial Virus B NOT DETECTED   Final   Influenza A NOT DETECTED   Final   Influenza B NOT DETECTED   Final   Parainfluenza 1 NOT DETECTED   Final   Parainfluenza 2 NOT DETECTED   Final   Parainfluenza 3 NOT DETECTED   Final   Metapneumovirus NOT DETECTED   Final   Rhinovirus NOT DETECTED   Final   Adenovirus NOT DETECTED   Final   Influenza A H1 NOT DETECTED   Final   Influenza A H3 NOT DETECTED   Final     Studies: No results found.  Scheduled Meds:  Continuous Infusions: . morphine 10 mg/hr (04/25/2013 0815)

## 2013-05-04 NOTE — Progress Notes (Signed)
Assessed patient with Kathlene November RN and announced time of death at 5. No heart beat or pulse.

## 2013-05-04 DEATH — deceased

## 2013-05-08 ENCOUNTER — Encounter (INDEPENDENT_AMBULATORY_CARE_PROVIDER_SITE_OTHER): Payer: Medicare Other | Admitting: Surgery

## 2014-04-28 IMAGING — CR DG CHEST 1V PORT
1 series · 1 of 1 positions shown · non-contrast
Comparison: 04/15/2013

CLINICAL DATA: Status post central line placement

EXAM:
PORTABLE CHEST - 1 VIEW

[AP]
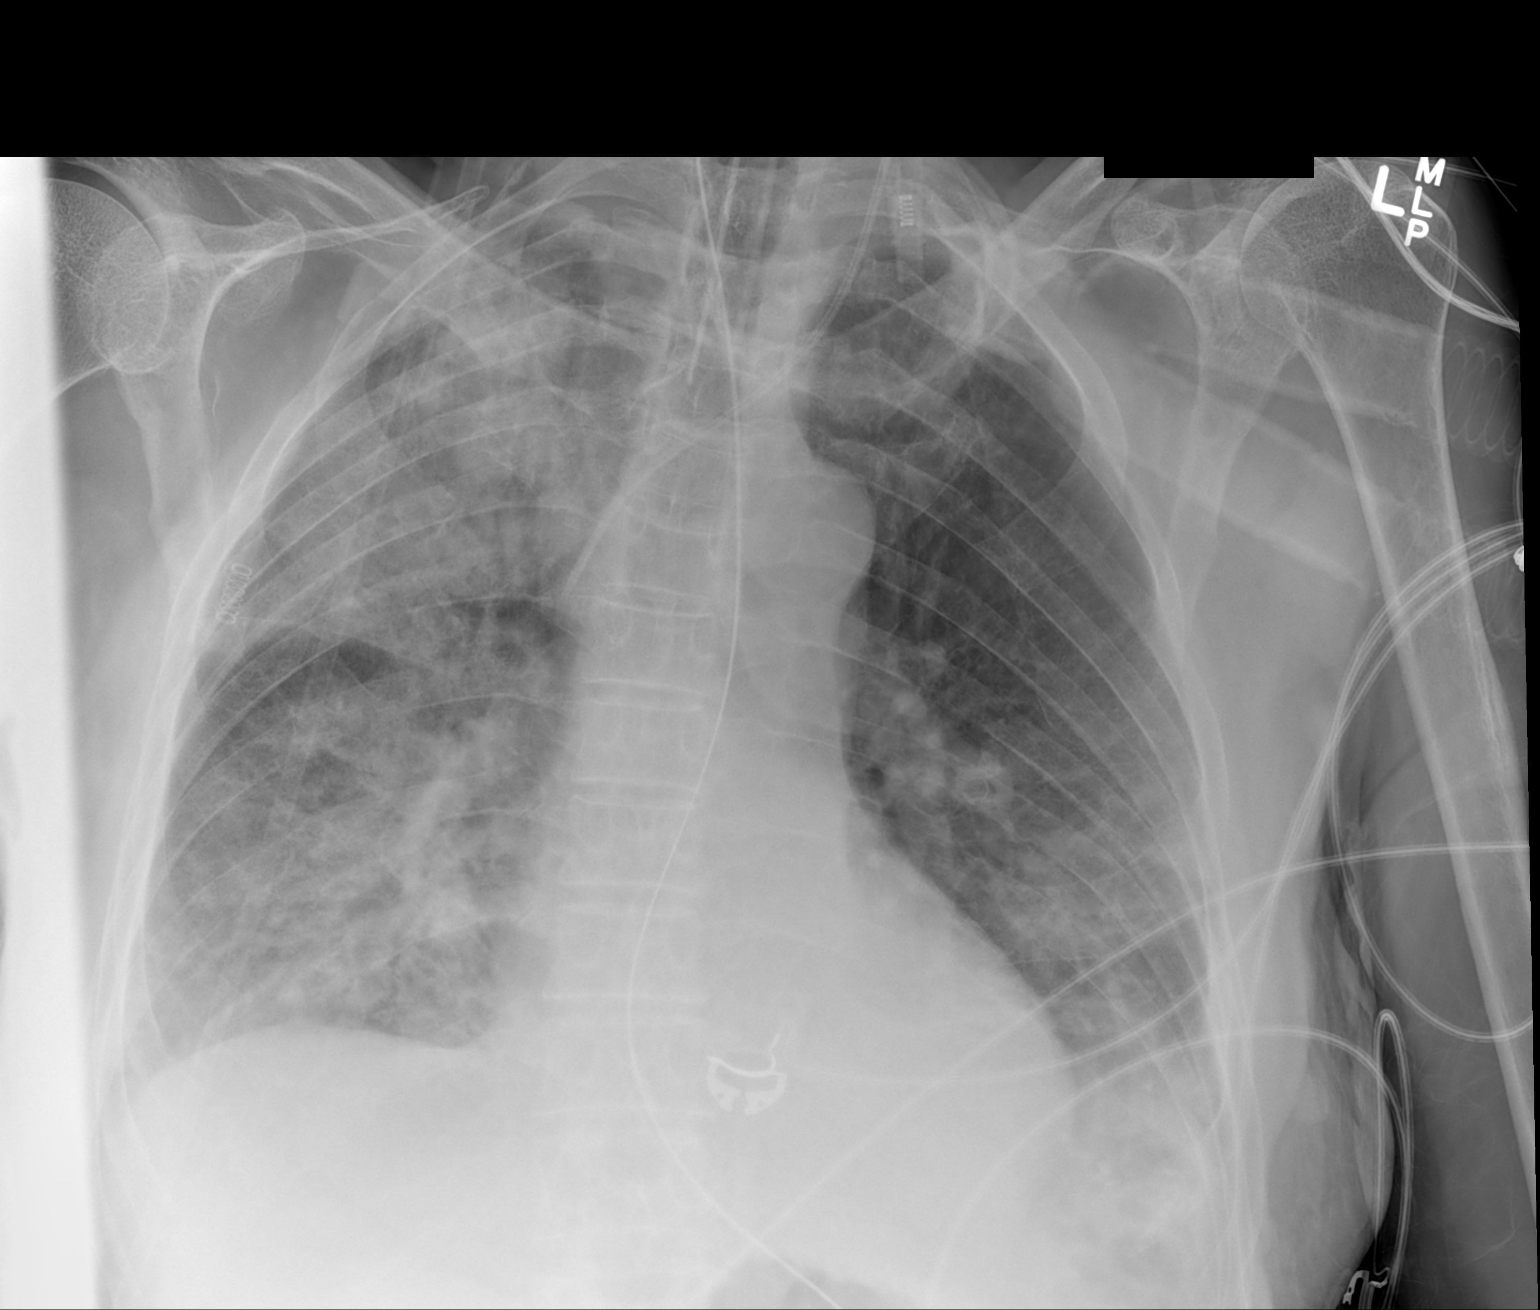

[1 of 1 positions shown; findings below may reference images not displayed]

FINDINGS: A new left jugular central line is noted with the catheter tip in
the proximal superior vena cava. No pneumothorax is noted. Diffuse
bilateral infiltrates are noted right greater than left stable from
the prior exam. A nasogastric catheter remains in the stomach. The
endotracheal tube is again seen and stable at 7.5 cm above the
carinal. The cardiac shadow is within normal limits.
IMPRESSION: Status post central line placement on the left. No pneumothorax is
noted. The catheter tip is noted in the proximal superior vena cava.
Remainder the chest stable from the previous day.

These results were called by telephone at the time of interpretation
on 04/16/2013 at [DATE] to Heike, the patient's nurse who verbally
acknowledged these results.

## 2014-04-30 IMAGING — CR DG CHEST 1V PORT
1 series · 1 of 1 positions shown · non-contrast
Comparison: 04/17/2013

CLINICAL DATA: Followup lung disease

EXAM:
PORTABLE CHEST - 1 VIEW

[AP]
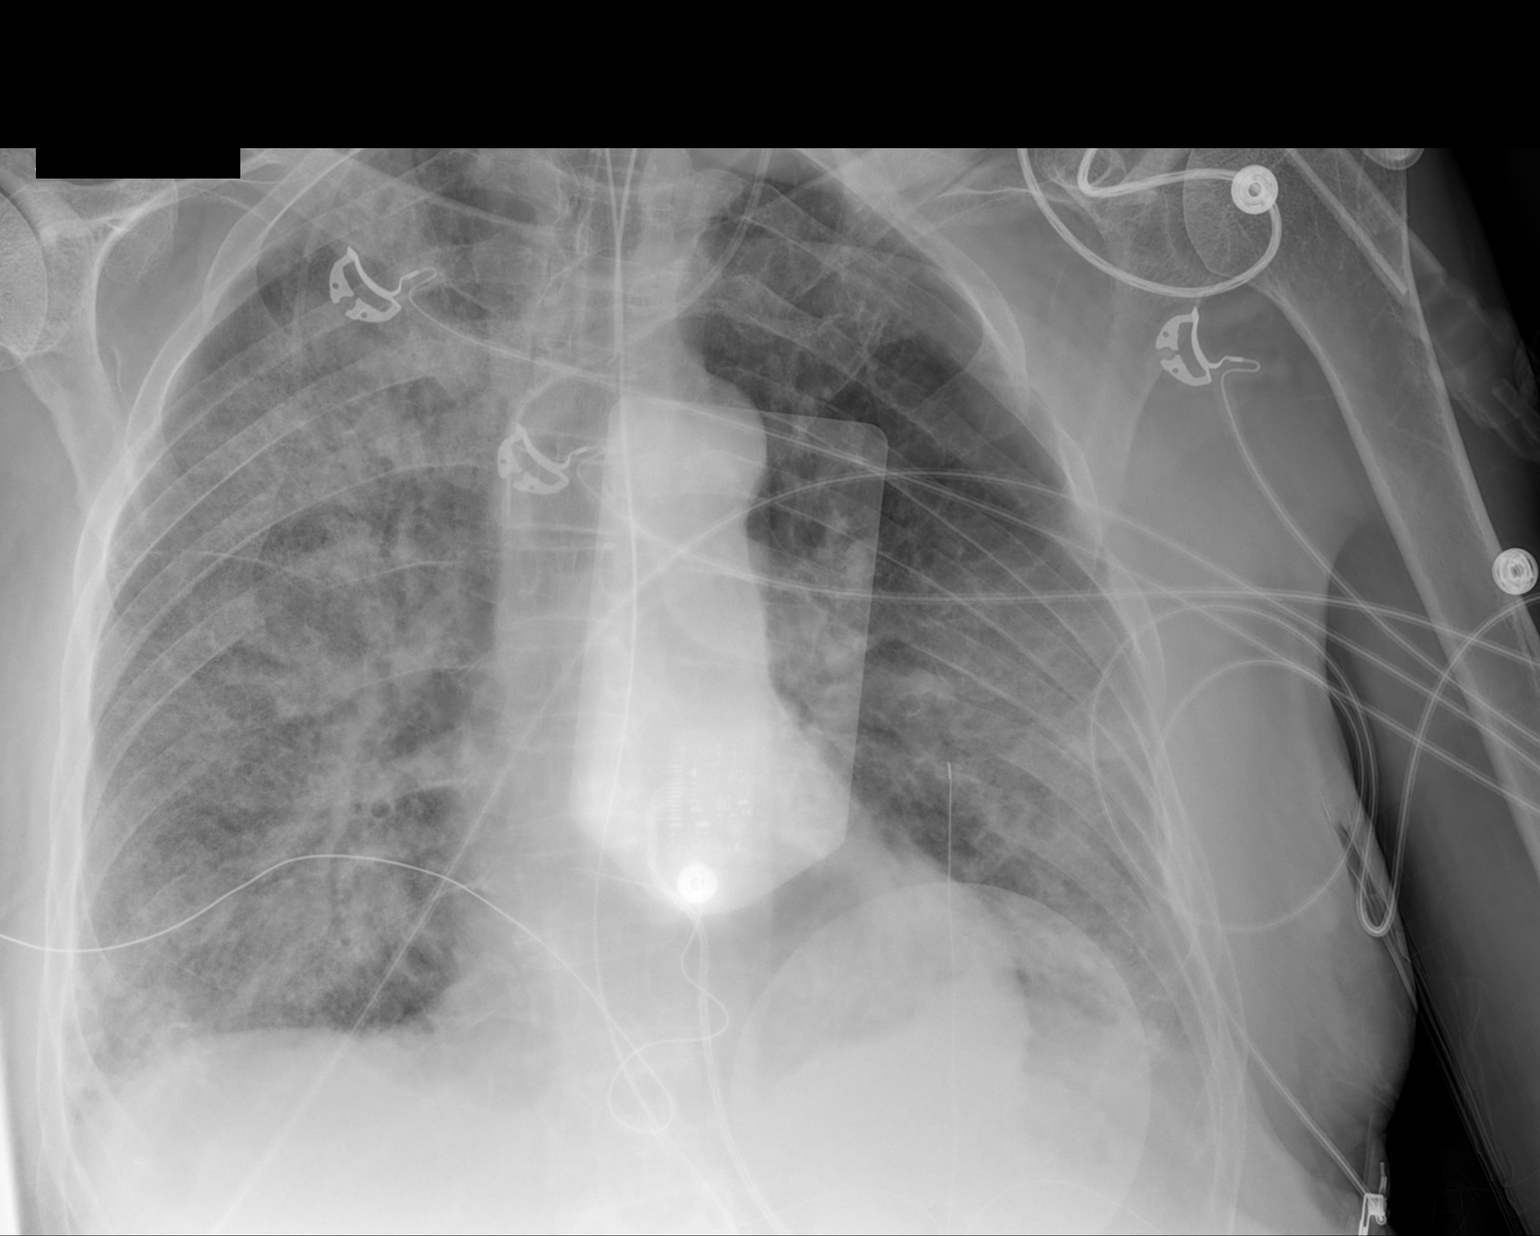

[1 of 1 positions shown; findings below may reference images not displayed]

FINDINGS: Cardiomediastinal silhouette is stable. Stable NG tube and
endotracheal tube position. Stable left IJ central line position.
Diffuse bilateral airspace disease again noted without change in
aeration.
IMPRESSION: Stable support apparatus. Diffuse bilateral airspace disease again
noted without change in aeration.

## 2014-05-02 IMAGING — CR DG CHEST 1V PORT
1 series · 1 of 1 positions shown · non-contrast
Comparison: 04/19/2013.

CLINICAL DATA: Evaluate endotracheal tube position.

EXAM:
PORTABLE CHEST - 1 VIEW

[AP]
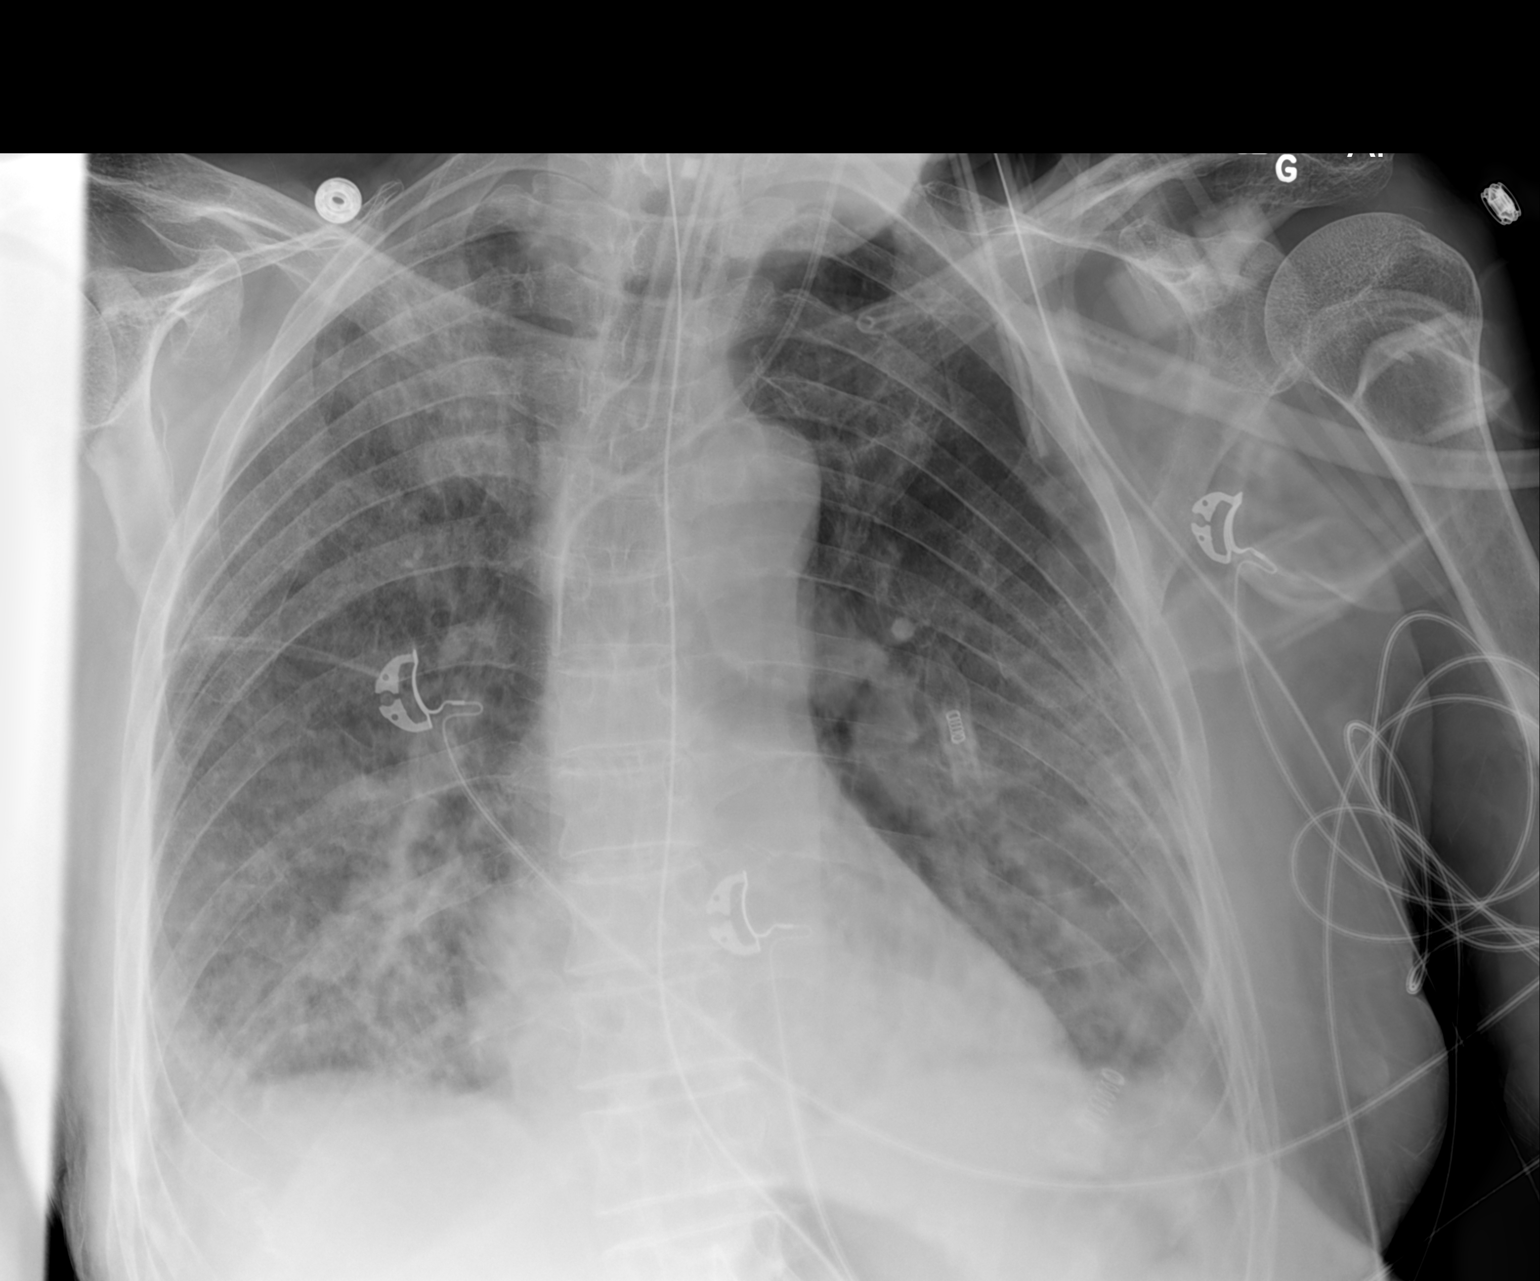

[1 of 1 positions shown; findings below may reference images not displayed]

FINDINGS: The defibrillator pads a been removed. Endotracheal tube tip is at
the level of the clavicles, 55 mm from the carina. Left IJ central
line unchanged with the tip in the mid SVC. Enteric tube present.
Monitoring leads project over the chest. Unchanged bilateral right
greater than left basilar predominant airspace disease with more
focal airspace disease in the right upper lobe. This is favored to
represent asymmetric/ atypical pulmonary edema.
IMPRESSION: 1. Support apparatus in good position.
2. No interval change in bilateral basilar predominant airspace
disease favored to represent edema.

## 2014-05-04 IMAGING — CR DG CHEST 1V PORT
1 series · 1 of 1 positions shown · non-contrast
Comparison: 04/21/2013.

CLINICAL DATA: Evaluate endotracheal tube.

EXAM:
PORTABLE CHEST - 1 VIEW

[AP]
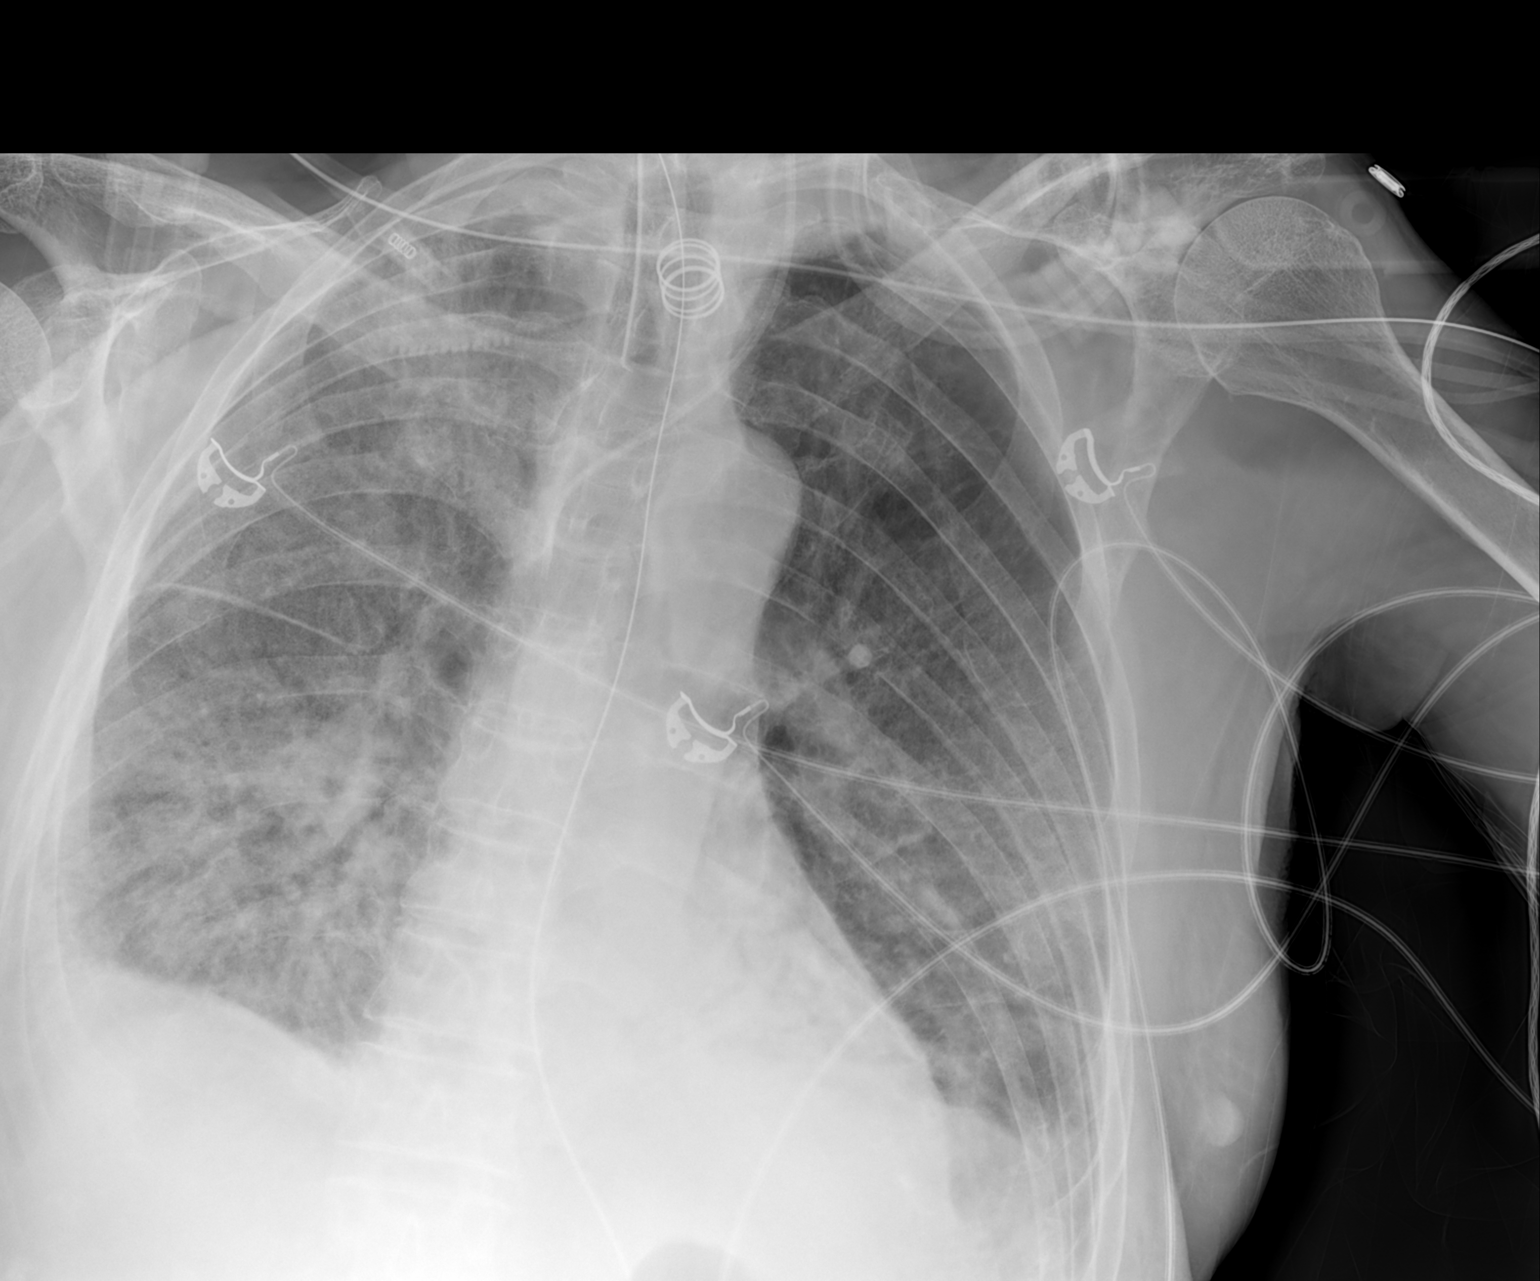

[1 of 1 positions shown; findings below may reference images not displayed]

FINDINGS: Cardiopericardial silhouette is within normal limits. Retrocardiac
density is present likely representing atelectasis and airspace
disease in combination. Enteric tube is present in the esophagus
with the tip not visualized left IJ central line remains present
with the tip in the upper SVC. Asymmetric right-greater-than-left
airspace disease is present. The right basilar airspace disease has
increased compared to yesterday's exam. Given the interval, this is
most compatible with increasing asymmetric pulmonary edema.
Superimposed pneumonia may be present.
IMPRESSION: 1. Stable support apparatus.
2. Increasing right chest airspace disease likely represents
pulmonary edema given knee increased since yesterday's study.
Underlying ARDS or pneumonia may also be present.

## 2014-05-05 IMAGING — CR DG CHEST 1V PORT
1 series · 1 of 1 positions shown · non-contrast
Comparison: 04/22/2013

CLINICAL DATA: Respiratory failure

EXAM:
PORTABLE CHEST - 1 VIEW

[AP]
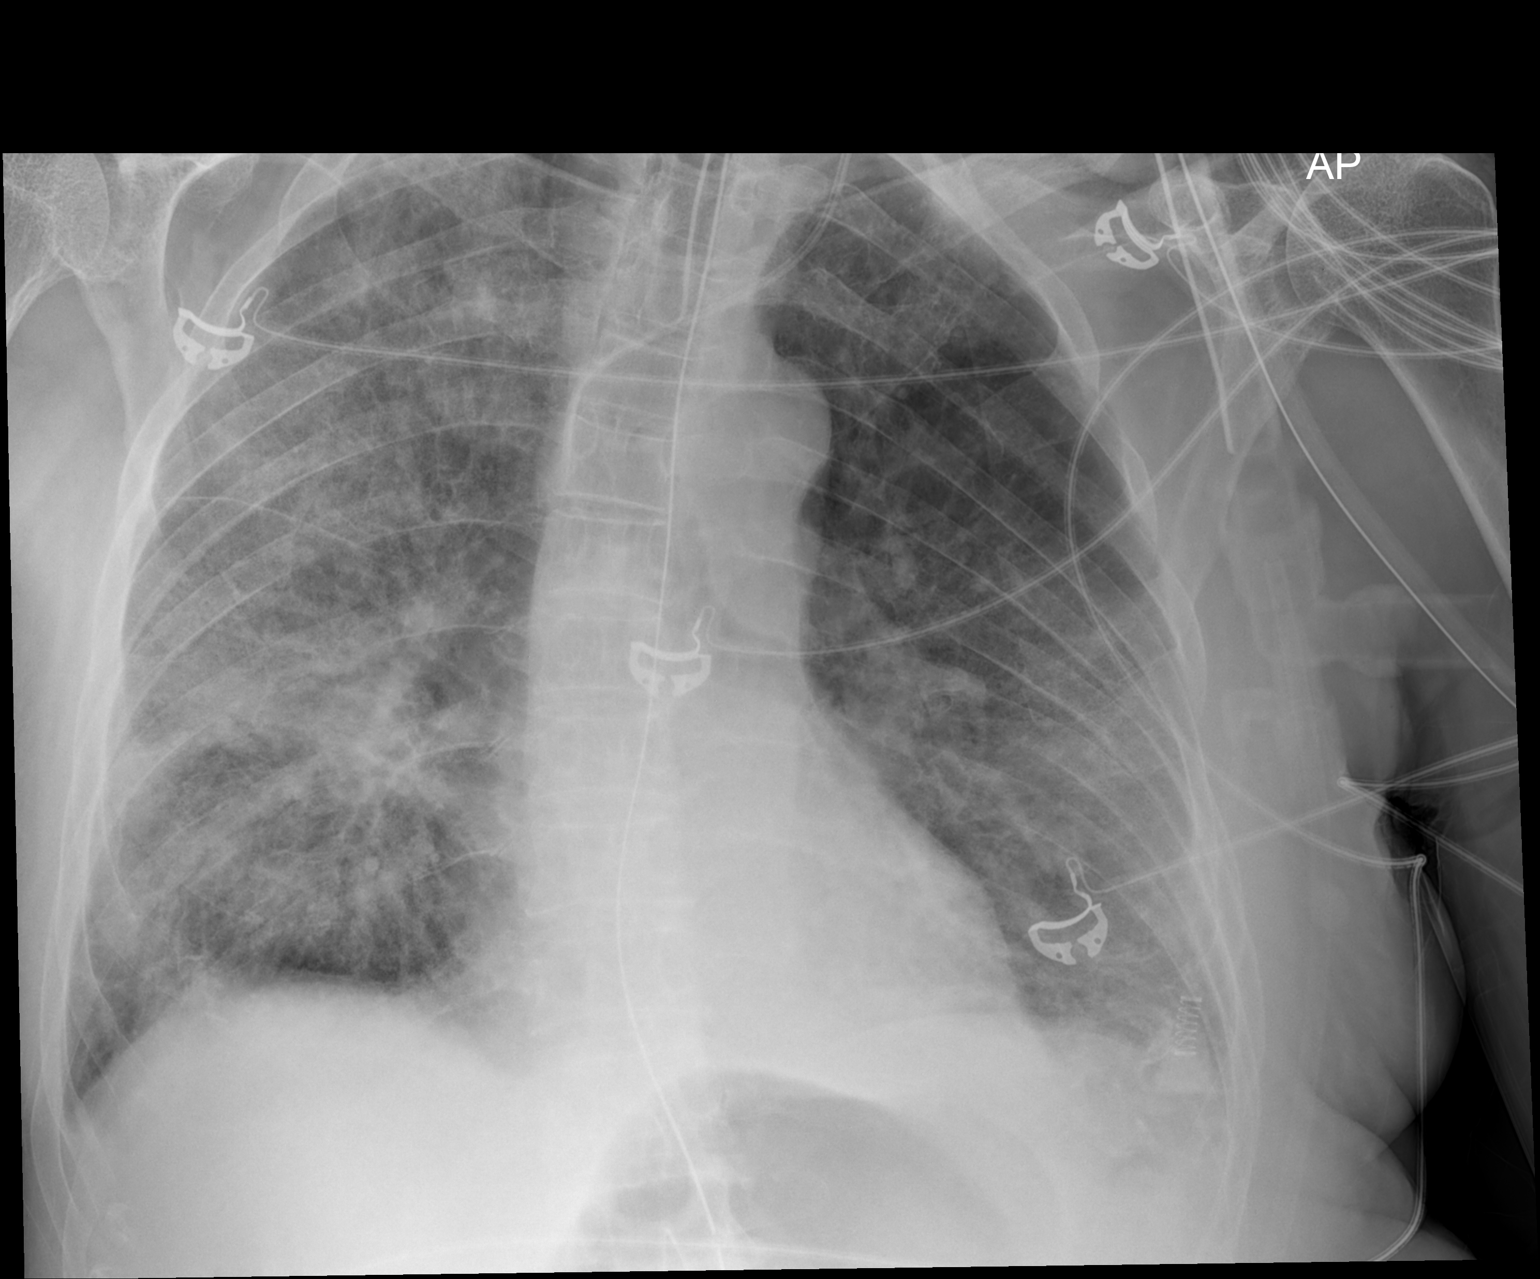

[1 of 1 positions shown; findings below may reference images not displayed]

FINDINGS: Endotracheal tube terminates 6 cm above the carina.

Multifocal patchy opacities in the right lung and left lung base,
suspicious for pneumonia. Asymmetric interstitial edema is
considered unlikely. No pleural effusion or pneumothorax.

The heart is normal in size.

Stable left IJ venous catheter terminating in the mid SVC. Enteric
tube courses into the stomach.
IMPRESSION: Endotracheal tube terminates 6 cm above the carina.

Multifocal patchy opacities, right lung predominant, suspicious for
pneumonia.
